# Patient Record
Sex: Male | Born: 1955 | Race: White | Hispanic: No | Marital: Single | Smoking: Current every day smoker
Health system: Southern US, Community
[De-identification: ages and names within clinical notes are randomized; demographics above are authoritative.]

## PROBLEM LIST (undated history)

## (undated) DIAGNOSIS — E785 Hyperlipidemia, unspecified: Secondary | ICD-10-CM

## (undated) DIAGNOSIS — K219 Gastro-esophageal reflux disease without esophagitis: Secondary | ICD-10-CM

## (undated) DIAGNOSIS — I10 Essential (primary) hypertension: Secondary | ICD-10-CM

## (undated) HISTORY — DX: Hyperlipidemia, unspecified: E78.5

## (undated) HISTORY — PX: ANKLE SURGERY: SHX546

## (undated) HISTORY — PX: FRACTURE SURGERY: SHX138

## (undated) HISTORY — DX: Essential (primary) hypertension: I10

---

## 1999-04-15 ENCOUNTER — Encounter: Payer: Self-pay | Admitting: Emergency Medicine

## 1999-04-15 ENCOUNTER — Emergency Department (HOSPITAL_COMMUNITY): Admission: EM | Admit: 1999-04-15 | Discharge: 1999-04-15 | Payer: Self-pay | Admitting: Emergency Medicine

## 2002-09-03 ENCOUNTER — Emergency Department (HOSPITAL_COMMUNITY): Admission: EM | Admit: 2002-09-03 | Discharge: 2002-09-03 | Payer: Self-pay | Admitting: Emergency Medicine

## 2002-09-03 ENCOUNTER — Encounter: Payer: Self-pay | Admitting: Emergency Medicine

## 2003-11-06 ENCOUNTER — Emergency Department (HOSPITAL_COMMUNITY): Admission: EM | Admit: 2003-11-06 | Discharge: 2003-11-07 | Payer: Self-pay

## 2004-06-03 ENCOUNTER — Encounter: Admission: RE | Admit: 2004-06-03 | Discharge: 2004-06-03 | Payer: Self-pay | Admitting: Orthopedic Surgery

## 2004-07-01 ENCOUNTER — Ambulatory Visit (HOSPITAL_BASED_OUTPATIENT_CLINIC_OR_DEPARTMENT_OTHER): Admission: RE | Admit: 2004-07-01 | Discharge: 2004-07-01 | Payer: Self-pay | Admitting: Orthopedic Surgery

## 2004-07-01 ENCOUNTER — Ambulatory Visit (HOSPITAL_COMMUNITY): Admission: RE | Admit: 2004-07-01 | Discharge: 2004-07-01 | Payer: Self-pay | Admitting: Orthopedic Surgery

## 2005-07-07 ENCOUNTER — Ambulatory Visit (HOSPITAL_BASED_OUTPATIENT_CLINIC_OR_DEPARTMENT_OTHER): Admission: RE | Admit: 2005-07-07 | Discharge: 2005-07-07 | Payer: Self-pay | Admitting: Orthopedic Surgery

## 2006-03-09 ENCOUNTER — Ambulatory Visit (HOSPITAL_BASED_OUTPATIENT_CLINIC_OR_DEPARTMENT_OTHER): Admission: RE | Admit: 2006-03-09 | Discharge: 2006-03-10 | Payer: Self-pay | Admitting: Orthopedic Surgery

## 2007-03-03 ENCOUNTER — Ambulatory Visit: Payer: Self-pay | Admitting: Internal Medicine

## 2007-03-03 ENCOUNTER — Ambulatory Visit: Payer: Self-pay | Admitting: Endocrinology

## 2007-03-03 DIAGNOSIS — E785 Hyperlipidemia, unspecified: Secondary | ICD-10-CM | POA: Insufficient documentation

## 2007-03-03 DIAGNOSIS — E559 Vitamin D deficiency, unspecified: Secondary | ICD-10-CM | POA: Insufficient documentation

## 2007-03-03 DIAGNOSIS — M818 Other osteoporosis without current pathological fracture: Secondary | ICD-10-CM | POA: Insufficient documentation

## 2007-03-03 DIAGNOSIS — F17200 Nicotine dependence, unspecified, uncomplicated: Secondary | ICD-10-CM | POA: Insufficient documentation

## 2007-03-04 LAB — CONVERTED CEMR LAB: TSH: 0.91 microintl units/mL (ref 0.35–5.50)

## 2007-03-07 LAB — CONVERTED CEMR LAB
Calcium, Total (PTH): 9.6 mg/dL (ref 8.4–10.5)
PTH: 45.4 pg/mL (ref 14.0–72.0)

## 2007-07-06 ENCOUNTER — Ambulatory Visit (HOSPITAL_BASED_OUTPATIENT_CLINIC_OR_DEPARTMENT_OTHER): Admission: RE | Admit: 2007-07-06 | Discharge: 2007-07-07 | Payer: Self-pay | Admitting: Orthopedic Surgery
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
## 2010-02-18 NOTE — Miscellaneous (Signed)
Summary: BONE DENSITY  Clinical Lists Changes  Orders: Added new Test order of T-Lumbar Vertebral Assessment (77082) - Signed 

## 2010-06-03 NOTE — Op Note (Signed)
NAMEKAORU, REZENDES                ACCOUNT NO.:  [id number]   MEDICAL RECORD NO.:  [id number]          PATIENT TYPE:  AMB   LOCATION:  DSC                          FACILITY:  [facility]   PHYSICIAN:  Leonides Grills, M.D.     DATE OF BIRTH:  1956-01-06   DATE OF PROCEDURE:  07/06/2007  DATE OF DISCHARGE:                               OPERATIVE REPORT   PREOPERATIVE DIAGNOSES:  1. Left subtalar nonunion.  2. Complication of hardware, left foot.   POSTOPERATIVE DIAGNOSES:  1. Left subtalar nonunion.  2. Complication of hardware, left foot.   OPERATION:  1. Revision left subtalar fusion.  2. Hardware removal deep, left foot.  3. Right iliac crest bone graft.  4. Stress x-rays foot.   ANESTHESIA:  General.   SURGEON:  Leonides Grills, MD   ASSISTANT:  Richardean Canal, PA-C.   ESTIMATED BLOOD LOSS:  Minimal.   TOURNIQUET TIME:  Approximately an hour and half.   COMPLICATIONS:  None.   DISPOSITION:  Stable to PR.   INDICATIONS:  This is a 55 year old male who is a smoker.  He has had  two previous attempted subtalar fusions despite telling him to abstain  from smoking.  Even today despite him emphatically denying smoking, you  could smell it on his clothing.  He was consented for the above  procedure.  All risks which included infection, nerve vessel injury,  nonunion, malunion, hardware irritation, hardware failure, persistent  pain, worsening pain, prolonged recovery, stiffness, arthritis in  neighboring joints, hardware irritation, and hardware failure were all  explained.  Questions were encouraged and answered.   OPERATION:  The patient was brought to the operating room and placed in  supine position initially after adequate general endotracheal anesthesia  was administered as well as Ancef 1 g IV piggyback.  We started the  procedure with him in the supine position and we prepped and draped in  sterile manner the right iliac crest graft site as well as the left  lower  extremity over proximally placed thigh tourniquet.  We started the  procedure by making a longitudinal incision over the right iliac crest  graft site.  Dissection was carried down through skin.  Hemostasis was  obtained.  Dissection was carried down to the crest of the iliac crest  and soft tissues elevated off the superior aspect of the iliac crest  over the iliac tubercle.  We then made a hinge using a sagittal saw,  elevated this up, and then obtained a copious amount of cancellus graft.  This was then placed on the back table.  We then sutured down the trap  door through drill holes.  We then  copiously irrigated the area with  normal saline.  Deep fascia and periosteum were closed with 0 Vicryl.  Subcu was closed with 2-0 Vicryl.  Skin was closed with 4-0 Monocryl  subcuticular stitch.  Steri-Strips were applied and Marcaine was applied  in the area without epinephrine.  We then placed underneath the sheaths  via an assistant a bump under the left hip internally rotating the left  lower  extremity and then the left lower extremity was gravity  exsanguinated and the tourniquet was elevated to 290 mmHg.  A  curvilinear incision through the old ollier incision was then made.  Dissection was carried down carefully through skin.  Hemostasis was  obtained.  The peroneal tendons were first identified in the tips of the  inferior aspects of the wound.  This was protected throughout the case.  Soft tissue namely the extensor digitorum brevis was elevated off the  calcaneal neck area anterior aspect of the lateral process of talus into  the sinus tarsi area.  We then entered the subtalar joint and there was  gross fibrous material in the anterior aspect of the subtalar joint.  This was then removed with a synovectomy rongeur, curette, and curved  corners osteotome.  Once this was meticulously done and either side of  the joint was prepared with a curved quarter-inch osteotome scaling the  area,  we then packed in the iliac crest graft using a [place] and a  Engineer, site.  Prior to packing this in place, the 6.5-mm partially-threaded  cancellous screw was removed with a large frag screwdriver under C-arm  guidance.  Once this was removed, we then prepped in the joint space.  The screw was removed through an incision in the heel that was done  previously and this was down to bone.  Once the screw was removed which  was intact and no evidence of hardware failure, we then packed the  subtalar joint with the iliac crest graft and then placed another 6.5-mm  32-mm threaded cancellous screw over a washer, and purposely, we drilled  directly next to a broken screw that was left in from the initial  surgery.  We used this as an interference screw so that when we tighten  the new screw, this would interfere against the broken screw and allow  even greater bite.  This worked up Agricultural consultant.  We were able to close  down the area very well with an excellent bite and using a washer so  that the head of the screw did not subside into the soft bone.  We then  obtained stress x-rays in the AP ankle and lateral views that showed no  gross motion and fixation proposition, excellent alignment as well.  Stress x-rays of foot and ankle in the AP ankle and lateral planes of  the foot showed no gross motion of the subtalar joint fixation and  proposition and excellent alignment as well.  Tourniquet was deflated  and hemostasis was obtained.  The area was copiously irrigated with  normal saline.  We then packed and infused bone graft into a small area  left within the subtalar joint in the anterior aspect of the sinus tarsi  area.  This was small infuse graft.  Once this was packed into place,  hemostasis was obtained.  Subcu was closed with 3-0 Vicryl.  Skin was  closed with 4-0 nylon over all wounds.  Sterile dressing was applied.  Modified Jones dressing was applied with the ankle in neutral  dorsiflexion.   The patient was stable to PR.      Leonides Grills, M.D.  Electronically Signed     PB/MEDQ  D:  07/06/2007  T:  07/07/2007  Job:  [id number]

## 2010-06-06 NOTE — Op Note (Signed)
NAMEDEXTON, [name]                ACCOUNT NO.:  [id number]   MEDICAL RECORD NO.:  [id number]          PATIENT TYPE:  AMB   LOCATION:  DSC                          FACILITY:  [facility]   PHYSICIAN:  Leonides Grills, M.D.     DATE OF BIRTH:  04/19/1955   DATE OF PROCEDURE:  07/07/2005  DATE OF DISCHARGE:                                 OPERATIVE REPORT   PREOPERATIVE DIAGNOSIS:  Complication of hardware, left foot, status post  subtalar distraction, bone block fusion.   POSTOPERATIVE DIAGNOSIS:  Complication of hardware, left foot, status post  subtalar distraction, bone block fusion.   OPERATION:  1.  Hardware removal, deep, left foot.  2.  Stress x-ray, left foot.   ANESTHESIA:  General.   SURGEON:  Leonides Grills, M.D.   ASSISTANT:  Lianne Cure, P.A.   ESTIMATED BLOOD LOSS:  Minimal.   TOURNIQUET:  None.   COMPLICATIONS:  None.   DISPOSITION:  Stable to the PR.   INDICATIONS FOR PROCEDURE:  This is a 55 year old male who is well over 1-  year status post subtalar distraction bone block fusion.  He has done well.  He had pain over his hardware.  He is consented for the above procedure.  All risks which include infection, nerve or vessel injury, persistence of  pain, worsening pain, prolonged recovery, stiffness or arthritis in  neighboring joints were all explained.  Questions were encouraged and  answered.   DESCRIPTION OF PROCEDURE:  The patient was brought to the operating room and  placed in the supine position.  After adequate general endotracheal tube  anesthesia was administered as well as Ancef 1 gram IV piggyback, the left  lower extremity was then prepped and draped in a sterile manner.  No  tourniquet was used.  Longitudinal incision over the previous incision was  made.  Dissection was carried down directly to bone and screw heads.  Screws  were removed.  One screw was broken at the level of the subtalar joint.  The  remaining part of the screw was left in the  bone.  Stress x-rays were  obtained and showed no gross motion across the subtalar joint and  appeared to be a solid union and showed also that this screw was left behind  and was broken at the level of the subtalar joint.  The wound was copiously  irrigated with normal saline.  Skin was closed with 4-0 nylon suture.  Sterile dressing was applied.  Hard shoe was applied.  The patient was  stable to the PR.      Leonides Grills, M.D.  Electronically Signed     PB/MEDQ  D:  07/07/2005  T:  07/08/2005  Job:  [id number]

## 2010-06-06 NOTE — Op Note (Signed)
NAMEALICK, LECOMTE                ACCOUNT NO.:  [id number]   MEDICAL RECORD NO.:  [id number]          PATIENT TYPE:  AMB   LOCATION:  DSC                          FACILITY:  [facility]   PHYSICIAN:  Leonides Grills, M.D.     DATE OF BIRTH:  October 16, 1955   DATE OF PROCEDURE:  03/09/2006  DATE OF DISCHARGE:                               OPERATIVE REPORT   PREOPERATIVE DIAGNOSIS:  Left subtalar nonunion.   POSTOPERATIVE DIAGNOSIS:  Left subtalar nonunion.   OPERATION:  1. Left subtalar fusion.  2. Left proximal tibial local bone graft.  3. Stress x-rays left foot.   ANESTHESIA:  General.   SURGEON:  Leonides Grills, M.D.   ASSISTANT:  Evlyn Kanner, P.A.-C.   ESTIMATED BLOOD LOSS:  Minimal   TOURNIQUET TIME:  Approximately an hour.   COMPLICATIONS:  None.   DISPOSITION:  Stable to PR.   INDICATIONS:  This is a 55 year old male who underwent a left subtalar  distraction bone block fusion.  He subsequently developed a nonunion  that was painful.  He was consented for the above procedure.  All risks  which include infection, neurovascular injury, nonunion, malunion,  hardware rotation, hardware failure, persistent pain, worsened pain,  prolonged recovery, stiffness, and arthritis were all explained.  Questions were encouraged and answered.   OPERATION:  The patient was brought to the operating and placed in a  supine position initially. After adequate general tube anesthesia was  administered with block as well as Ancef 1 gram IV piggyback, the  patient was then placed in the lateral decubitus position with left  operative side up.  All bony prominences were well padded.  An axillary  roll was placed.  The left lower extremity is then prepped and draped in  a sterile manner over a proximally placed thigh tourniquet.  The limb  was gravity exsanguinated and the tourniquet inflated to 290 mmHg.  A  longitudinal incision over Gerdy's tubercle was then made.  Dissection  was carried  down through the skin.  Hemostasis was obtained.  A small L-  shaped periosteal flap was then elevated and with a curved 1/4 inch  osteotome, a small 1 cm by 1 cm hinge was then developed in the outer  cortex. Cancellus bone was then harvested measuring approximately 5-8  mL.  This was placed on the back table.  This was obtained using a  curet.  The hatch was then placed down and the periosteum and a portion  of IT band tendinous insertion was repaired with 2-0 Vicryl.  This had  an Conservation officer, historic buildings.  The area was copiously irrigated with normal  saline.  The subcu was closed with 2-0 Vicryl.  The skin was closed with  4-0 nylon.   We then made an Ollier incision in the sinus tarsi area.  Dissection was  carried down through the skin.  Hemostasis was obtained.  The transverse  digitorum brevis was then sharply elevated and retracted out of harm's  way.  The sinus tarsi was then entered and under C-arm guidance with a  curved 1/4 inch osteotome, the  subtalar joint was entered. The lateral  portion of the subtalar joint was non-united.  The medial part was  intact. We cleaned out about 3/4 of the subtalar joint with the curet as  well as a rongeur as well as a curved 1/4 inch osteotome. We completed  the osteotomy through the subtalar joint with a curved 1/4 inch  osteotome under C-arm guidance carefully, elevated the joint with the  laminar spreader, and then fish-scaled both sides of the subtalar joint  respectively.  We then placed 1.2 mL of VITOSS synthetic bone graft and  mixed this with the cancellus bone obtained locally, made this into a  slurry as well as bone marrow aspirate that was obtained from the  proximal tibia as well as the subtalar joint respectively, which was  mixed into a slurry. This was then packed into the subtalar joint.  We  then placed this a longitudinal incision where the previous incision was  made, a 6.5 mm 16 mm threaded cancellus lag screw using 4.5 and  3.2 mm  drill holes respectively.  This had excellent purchase and maintenance  of the correction.  This was purposely cheated into the lateral portion  of the talus. X-rays were obtained, lateral and AP views of the ankle  and subtalar joint area and stress x-rays showed no gross motion,  fixation in proper position, and compression excellent, as well.  Visually, this was compressed. Stress strain relieving bone graft was  applied in the anterior aspect of the posterior facet of the subtalar  joint with the extra local bone graft that was obtained.  The area was  copiously irrigated with normal saline prior to this. The capsule and  deep tissues were closed with 2-0 Vicryl.  The subcu was closed with 3-0  Vicryl.  The skin was closed with 4-0 nylon.  A sterile dressing was  applied.  Prior to this, the tourniquet was deflated and hemostasis was  obtained.  This was done prior to impaction of the graft. Once the wound  was closed with 4-0 nylon stitch, a sterile dressing was applied, a  modified Jones dressing was applied.  The patient was stable to PR.      Leonides Grills, M.D.  Electronically Signed     PB/MEDQ  D:  03/09/2006  T:  03/09/2006  Job:  [id number]

## 2010-06-06 NOTE — Op Note (Signed)
[name], [name]                ACCOUNT NO.:  [id number]   MEDICAL RECORD NO.:  [id number]          PATIENT TYPE:  AMB   LOCATION:  DSC                          FACILITY:  [facility]   PHYSICIAN:  Leonides Grills, M.D.     DATE OF BIRTH:  1955/05/27   DATE OF PROCEDURE:  07/01/2004  DATE OF DISCHARGE:                                 OPERATIVE REPORT   PREOPERATIVE DIAGNOSES:  1.  Left post traumatic subtalar joint arthritis.  2.  Left tight Achilles tendon.  3.  Left hindfoot malalignment.   POSTOPERATIVE DIAGNOSES:  1.  Left post traumatic subtalar joint arthritis.  2.  Left tight Achilles tendon.  3.  Left hindfoot malalignment.   OPERATION PERFORMED:  1.  Left subtalar distraction, bone block fusion.  2.  Left iliac crest bone graft.  3.  Stress x-rays of left ankle.  4.  Left percutaneous tendo-Achilles lengthening.   SURGEON:  Leonides Grills, M.D.   ASSISTANT:  Lianne Cure, P.A.   ANESTHESIA:  General with popliteal block.   ESTIMATED BLOOD LOSS:  Minimal.   TOURNIQUET TIME:  Approximately an hour and a half.   COMPLICATIONS:  None.   DISPOSITION:  Stable to PR.   INDICATIONS FOR PROCEDURE:  The patient is a 55 year old male who sustained  a calcaneus fracture and developed subtalar joint arthritis with collapse  and horizontal talus with anterior ankle impingement.  He was consented for  the above procedure.  All risks which include infection, neurovascular  injury, nonunion, malunion, hardware irritation, hardware failure,  persistent pain, worsening pain, arthritis in neighboring joints, stiffness,  prolonged recovery were all explained, questions were encouraged and  answered.   DESCRIPTION OF PROCEDURE:  The patient was brought to the operating room and  placed initially in supine position after adequate general endotracheal tube  anesthesia was administered.  Popliteal block as well as Ancef 1 g IV  piggyback.  The patient was then placed in a lateral  decubitus position with  the left operative side up and the left iliac crest bone graft site as well  as left lower extremity were prepped and draped in sterile manner over a  proximally placed thigh tourniquet.  We started the procedure with a  longitudinal incision over the left iliac crest.  Dissection was carried  down through skin.  Hemostasis was obtained.  Dissection was carried down  directly to the crest which was palpable throughout the dissection.  Soft  tissue was elevated off the inner and outer table and was meticulously  stayed onto bone.  Soft tissues were protected along the medial and lateral  columns of the iliac crest.  With a 1 cm sagittal saw, a trapezoidal shaped  tricortical bone graft was harvested using a curved   quarter inch osteotome  as well.  This was placed on a back table for later placement.  Cancellous  bone was then scooped out from either side of the crest respectively and  again this was placed on the back table as well for later graft.  We then  copiously irrigated the area  with normal saline.  Bone wax was applied to  exposed bone surfaces.  Again it was copiously irrigated with normal saline.  There was no pulsatile bleeding or bleeding within the wound.  Deep tissue  and fascia were closed with 0 Vicryl suture.  Subcu was closed with 2-0  Vicryl suture, skin was closed with 4-0 Monocryl  subcuticular, Steri-Strips  were applied.  We then gravity exsanguinated the left lower extremity and  the tourniquet was elevated 290 mmHg.  A longitudinal incision over the  posterolateral aspect of the left ankle was then made.  Dissection was  carried down through skin and hemostasis was obtained.  Sural nerve was  identified and sural nerve neurolysis within the wound was then performed  and the nerve was retracted anteriorly and protected out of harm's way  throughout the entire procedure.  Dissection was carried down directly to  the subtalar joint.  There  was scar tissue in this area and this was  rongeured out under C-arm guidance.  The subtalar joint was verified up with  the Glorious Peach to be in the proper position.  Once the posterior capsule was  removed from the talocalcaneal area as well as soft tissue, we then placed a  laminar spreader within the joint to jack out the joint.  The remaining  arthritic cartilage was then removed with a curved quarter inch osteotome as  well as a curette.  Once this was completely denuded of cartilage, multiple  2 mm drill holes were placed on either side of the joint after the area was  copiously irrigated with normal saline and at this point we verified with  the lamina spreader in place and the size of the bone blocks that we  harvested that the hindfoot was in proper position with 5 to 7 degrees  hindfoot valgus.  We then placed the cancellous graft in first as a bed  within the subtalar joint as well as anteriorly.  We then tamped in the  tricortical bone block within the subtalar joint.  This fit beautifully and  was tamped into place with a bone tamp.  Once this was in a proper position  and verified under C-arm guidance to be in adequate position and palpated,  with stress x-rays we were able to range the ankle in dorsiflexion and  plantar flexion and found that there were no impinging areas whatsoever.  This was very stable innately.  Once this was tamped into place, we then  with a 3.5 mm drill through a stab wound that was longitudinally midline in  the glabral skin area of the heel, drilled a drill hole across the subtalar  joint.  The first screw which was in the lateral body with a 6.5 mm fully  threaded cancellous screw measuring about 70 mm.  The second screw which was  started just medial to this was aimed towards the main body base of the  talar neck area.  This was a 75 mm long 6.5 mm fully threaded cancellous  screw.  Both screws had excellent purchase and maintenance of the correction.   Final stress x-rays were obtained in the AP ankle mortise view  as well as lateral and showed that the fixation was in a proper position as  well as correction with maximum dorsiflexion.  There was adequate clearance  anteriorly, anterior part of the talar neck and anterior distal tibia and  then plantar flexion had adequate clearance posteriorly as well.  The  remaining bone  graft was obtained and drills were placed in the subtalar  joint and bur was used to perform stress-strain relieving bone graft.  After  the area was copiously irrigated with normal saline, tourniquet was  deflated, hemostasis was obtained and there was no pulsatile bleeding.  Subcu was closed with 3-0 Vicryl.  Skin was closed with 4-0 nylon.  We then  performed a percutaneous tendo-Achilles lengthening with two medial and one  lateral hemisections in the Achilles tendon.  This had an excellent release  of the tight Achilles tendon and this was performed as described by Myrene Buddy.  At this point once the wounds were all closed with 4-0 nylon suture, sterile  dressing was applied.  Modified Jones dressing was applied with the ankle in  neutral dorsiflexion.  The patient was stable to the PR.     PB/MEDQ  D:  07/01/2004  T:  07/01/2004  Job:  [id number]

## 2010-10-16 LAB — POCT HEMOGLOBIN-HEMACUE: Hemoglobin: 14.7

## 2015-04-05 DIAGNOSIS — Z0000 Encounter for general adult medical examination without abnormal findings: Secondary | ICD-10-CM | POA: Diagnosis not present

## 2015-04-05 DIAGNOSIS — I10 Essential (primary) hypertension: Secondary | ICD-10-CM | POA: Diagnosis not present

## 2015-04-05 DIAGNOSIS — E782 Mixed hyperlipidemia: Secondary | ICD-10-CM | POA: Diagnosis not present

## 2015-06-24 DIAGNOSIS — M79671 Pain in right foot: Secondary | ICD-10-CM | POA: Diagnosis not present

## 2015-06-24 DIAGNOSIS — G8929 Other chronic pain: Secondary | ICD-10-CM | POA: Diagnosis not present

## 2015-06-24 DIAGNOSIS — M79672 Pain in left foot: Secondary | ICD-10-CM | POA: Diagnosis not present

## 2015-07-08 DIAGNOSIS — E782 Mixed hyperlipidemia: Secondary | ICD-10-CM | POA: Diagnosis not present

## 2015-07-08 DIAGNOSIS — Z7689 Persons encountering health services in other specified circumstances: Secondary | ICD-10-CM | POA: Diagnosis not present

## 2015-07-08 DIAGNOSIS — Z125 Encounter for screening for malignant neoplasm of prostate: Secondary | ICD-10-CM | POA: Diagnosis not present

## 2015-07-08 DIAGNOSIS — Z131 Encounter for screening for diabetes mellitus: Secondary | ICD-10-CM | POA: Diagnosis not present

## 2015-07-08 DIAGNOSIS — I10 Essential (primary) hypertension: Secondary | ICD-10-CM | POA: Diagnosis not present

## 2015-10-08 DIAGNOSIS — Z0000 Encounter for general adult medical examination without abnormal findings: Secondary | ICD-10-CM | POA: Diagnosis not present

## 2015-10-08 DIAGNOSIS — I10 Essential (primary) hypertension: Secondary | ICD-10-CM | POA: Diagnosis not present

## 2015-10-08 DIAGNOSIS — Z1389 Encounter for screening for other disorder: Secondary | ICD-10-CM | POA: Diagnosis not present

## 2015-10-08 DIAGNOSIS — E782 Mixed hyperlipidemia: Secondary | ICD-10-CM | POA: Diagnosis not present

## 2015-11-20 DIAGNOSIS — I10 Essential (primary) hypertension: Secondary | ICD-10-CM | POA: Diagnosis not present

## 2015-11-20 DIAGNOSIS — E784 Other hyperlipidemia: Secondary | ICD-10-CM | POA: Diagnosis not present

## 2016-01-02 DIAGNOSIS — I10 Essential (primary) hypertension: Secondary | ICD-10-CM | POA: Diagnosis not present

## 2016-01-02 DIAGNOSIS — E784 Other hyperlipidemia: Secondary | ICD-10-CM | POA: Diagnosis not present

## 2016-01-07 DIAGNOSIS — M545 Low back pain: Secondary | ICD-10-CM | POA: Diagnosis not present

## 2016-01-07 DIAGNOSIS — I10 Essential (primary) hypertension: Secondary | ICD-10-CM | POA: Diagnosis not present

## 2016-01-07 DIAGNOSIS — E782 Mixed hyperlipidemia: Secondary | ICD-10-CM | POA: Diagnosis not present

## 2016-02-04 DIAGNOSIS — E782 Mixed hyperlipidemia: Secondary | ICD-10-CM | POA: Diagnosis not present

## 2016-02-04 DIAGNOSIS — I10 Essential (primary) hypertension: Secondary | ICD-10-CM | POA: Diagnosis not present

## 2016-02-04 DIAGNOSIS — M545 Low back pain: Secondary | ICD-10-CM | POA: Diagnosis not present

## 2016-02-27 DIAGNOSIS — E782 Mixed hyperlipidemia: Secondary | ICD-10-CM | POA: Diagnosis not present

## 2016-02-27 DIAGNOSIS — M545 Low back pain: Secondary | ICD-10-CM | POA: Diagnosis not present

## 2016-02-27 DIAGNOSIS — I10 Essential (primary) hypertension: Secondary | ICD-10-CM | POA: Diagnosis not present

## 2016-03-24 DIAGNOSIS — E782 Mixed hyperlipidemia: Secondary | ICD-10-CM | POA: Diagnosis not present

## 2016-03-24 DIAGNOSIS — I10 Essential (primary) hypertension: Secondary | ICD-10-CM | POA: Diagnosis not present

## 2016-03-24 DIAGNOSIS — M545 Low back pain: Secondary | ICD-10-CM | POA: Diagnosis not present

## 2016-04-08 DIAGNOSIS — Z1389 Encounter for screening for other disorder: Secondary | ICD-10-CM | POA: Diagnosis not present

## 2016-04-08 DIAGNOSIS — I10 Essential (primary) hypertension: Secondary | ICD-10-CM | POA: Diagnosis not present

## 2016-04-08 DIAGNOSIS — Z0000 Encounter for general adult medical examination without abnormal findings: Secondary | ICD-10-CM | POA: Diagnosis not present

## 2016-04-08 DIAGNOSIS — M545 Low back pain: Secondary | ICD-10-CM | POA: Diagnosis not present

## 2016-04-08 DIAGNOSIS — E782 Mixed hyperlipidemia: Secondary | ICD-10-CM | POA: Diagnosis not present

## 2016-04-27 DIAGNOSIS — I10 Essential (primary) hypertension: Secondary | ICD-10-CM | POA: Diagnosis not present

## 2016-04-27 DIAGNOSIS — E784 Other hyperlipidemia: Secondary | ICD-10-CM | POA: Diagnosis not present

## 2016-05-28 DIAGNOSIS — E784 Other hyperlipidemia: Secondary | ICD-10-CM | POA: Diagnosis not present

## 2016-05-28 DIAGNOSIS — I10 Essential (primary) hypertension: Secondary | ICD-10-CM | POA: Diagnosis not present

## 2016-07-10 DIAGNOSIS — E782 Mixed hyperlipidemia: Secondary | ICD-10-CM | POA: Diagnosis not present

## 2016-07-10 DIAGNOSIS — I10 Essential (primary) hypertension: Secondary | ICD-10-CM | POA: Diagnosis not present

## 2016-07-13 DIAGNOSIS — E782 Mixed hyperlipidemia: Secondary | ICD-10-CM | POA: Diagnosis not present

## 2016-07-13 DIAGNOSIS — I10 Essential (primary) hypertension: Secondary | ICD-10-CM | POA: Diagnosis not present

## 2016-07-13 DIAGNOSIS — Z125 Encounter for screening for malignant neoplasm of prostate: Secondary | ICD-10-CM | POA: Diagnosis not present

## 2016-10-02 DIAGNOSIS — E784 Other hyperlipidemia: Secondary | ICD-10-CM | POA: Diagnosis not present

## 2016-10-02 DIAGNOSIS — I10 Essential (primary) hypertension: Secondary | ICD-10-CM | POA: Diagnosis not present

## 2016-10-15 DIAGNOSIS — M85851 Other specified disorders of bone density and structure, right thigh: Secondary | ICD-10-CM | POA: Diagnosis not present

## 2016-10-15 DIAGNOSIS — E782 Mixed hyperlipidemia: Secondary | ICD-10-CM | POA: Diagnosis not present

## 2016-10-15 DIAGNOSIS — M810 Age-related osteoporosis without current pathological fracture: Secondary | ICD-10-CM | POA: Diagnosis not present

## 2016-10-15 DIAGNOSIS — I10 Essential (primary) hypertension: Secondary | ICD-10-CM | POA: Diagnosis not present

## 2016-11-20 ENCOUNTER — Ambulatory Visit (INDEPENDENT_AMBULATORY_CARE_PROVIDER_SITE_OTHER): Payer: Worker's Compensation

## 2016-11-20 ENCOUNTER — Encounter: Payer: Self-pay | Admitting: Family Medicine

## 2016-11-20 ENCOUNTER — Ambulatory Visit: Payer: Worker's Compensation

## 2016-11-20 ENCOUNTER — Ambulatory Visit (INDEPENDENT_AMBULATORY_CARE_PROVIDER_SITE_OTHER): Payer: Worker's Compensation | Admitting: Family Medicine

## 2016-11-20 VITALS — BP 134/87 | HR 86 | Temp 97.4°F | Ht 66.0 in | Wt 215.0 lb

## 2016-11-20 DIAGNOSIS — M25572 Pain in left ankle and joints of left foot: Secondary | ICD-10-CM | POA: Diagnosis not present

## 2016-11-20 NOTE — Progress Notes (Signed)
BP 134/87   Pulse 86   Temp (!) 97.4 F (36.3 C) (Oral)   Ht 5\' 6"  (1.676 m)   Wt 215 lb (97.5 kg)   BMI 34.70 kg/m    Subjective:    Patient ID: [name], male    DOB: [birth year], 61 y.o.   MRN: [id number]  HPI: [name] is a 61 y.o. 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  It is a workers comp visit.  The injury happened today 11/20/2016.  He was at work and somebody was moving pallets with a forklift and the palate hit the inside of his foot with the corner of the palate.  Since that time he has been having difficulty walking on it he has swelling bruising and discoloration in his toes.  He said he can bear some weight on it but it hurts significantly to do that.  He denies any fevers or chills or redness or warmth.  He does admit that he has a history of previous ankle fracture and trauma that started in 2005 and has had surgeries for that but he was doing well and that is why he was back at work until this happened.  Relevant past medical, surgical, family and social history reviewed and updated as indicated. Interim medical history since our last visit reviewed. Allergies and medications reviewed and updated.  Review of Systems  Constitutional: Negative for chills and fever.  Respiratory: Negative for shortness of breath and wheezing.   Cardiovascular: Negative for chest pain and leg swelling.  Musculoskeletal: Positive for arthralgias and joint swelling. Negative for back pain and gait problem.  Skin: Positive for color change. Negative for rash.  All other systems reviewed and are negative.   Per HPI unless specifically indicated above        Objective:    BP 134/87   Pulse 86   Temp (!) 97.4 F (36.3 C) (Oral)   Ht 5\' 6"  (1.676 m)   Wt 215  lb (97.5 kg)   BMI 34.70 kg/m   Wt Readings from Last 3 Encounters:  11/20/16 215 lb (97.5 kg)    Physical Exam  Constitutional: He is oriented to person, place, and time. He appears well-developed and well-nourished. No distress.  Eyes: Conjunctivae are normal. No scleral icterus.  Cardiovascular: Regular rhythm.   Musculoskeletal: Normal range of motion. He exhibits no edema.       Left ankle: He exhibits swelling and ecchymosis. He exhibits normal range of motion, no deformity, no laceration and normal pulse. Tenderness. Medial malleolus, AITFL and posterior TFL tenderness found. Achilles tendon exhibits no pain.       Feet:  Neurological: He is alert and oriented to person, place, and time. Coordination normal.  Skin: Skin is warm and dry. No rash noted. He is not diaphoretic.  Psychiatric: He has a normal mood and affect. His behavior is normal.  Nursing note and vitals reviewed.   Left ankle x-ray: Difficult to tell if there is any new fractures because of previous calcaneal fractures with screws, await radiologist read    Assessment & Plan:   Problem List Items Addressed This Visit    None    Visit Diagnoses    Acute left ankle pain    -  Primary   Patient has history of multiple surgeries in the ankle,   Relevant Orders   DG Foot Complete Left (Completed)   DG Ankle Complete Left (Completed)   DME Crutches     Patient is recommended to be nonweightbearing until he hears back from [place] about the ankle x-ray, from there he will have to take 30-40 minutes max on feet and then breaks between as tolerated and gradually increase the amount that he can be on his feet over the next 2 weeks.  If fractured we will get him to orthopedic and he will continue to be nonweightbearing  Follow up plan: Return if symptoms worsen or fail to improve.  Counseling provided for all of the vaccine components Orders Placed This Encounter  Procedures  . DG Foot Complete Left  . DG Ankle  Complete Left    Caryl Pina, MD [facility] 11/20/2016, 11:33 AM

## 2016-11-23 ENCOUNTER — Telehealth: Payer: Self-pay | Admitting: Family Medicine

## 2016-11-23 ENCOUNTER — Encounter: Payer: Self-pay | Admitting: *Deleted

## 2017-01-13 DIAGNOSIS — E782 Mixed hyperlipidemia: Secondary | ICD-10-CM | POA: Diagnosis not present

## 2017-01-13 DIAGNOSIS — I10 Essential (primary) hypertension: Secondary | ICD-10-CM | POA: Diagnosis not present

## 2017-01-13 DIAGNOSIS — K219 Gastro-esophageal reflux disease without esophagitis: Secondary | ICD-10-CM | POA: Diagnosis not present

## 2017-01-14 DIAGNOSIS — E782 Mixed hyperlipidemia: Secondary | ICD-10-CM | POA: Diagnosis not present

## 2017-01-14 DIAGNOSIS — I10 Essential (primary) hypertension: Secondary | ICD-10-CM | POA: Diagnosis not present

## 2017-03-10 DIAGNOSIS — H1013 Acute atopic conjunctivitis, bilateral: Secondary | ICD-10-CM | POA: Diagnosis not present

## 2017-03-10 DIAGNOSIS — H5213 Myopia, bilateral: Secondary | ICD-10-CM | POA: Diagnosis not present

## 2017-03-10 DIAGNOSIS — H40033 Anatomical narrow angle, bilateral: Secondary | ICD-10-CM | POA: Diagnosis not present

## 2017-04-15 DIAGNOSIS — L570 Actinic keratosis: Secondary | ICD-10-CM | POA: Diagnosis not present

## 2017-04-15 DIAGNOSIS — E782 Mixed hyperlipidemia: Secondary | ICD-10-CM | POA: Diagnosis not present

## 2017-04-15 DIAGNOSIS — I10 Essential (primary) hypertension: Secondary | ICD-10-CM | POA: Diagnosis not present

## 2017-07-15 DIAGNOSIS — I10 Essential (primary) hypertension: Secondary | ICD-10-CM | POA: Diagnosis not present

## 2017-07-15 DIAGNOSIS — K219 Gastro-esophageal reflux disease without esophagitis: Secondary | ICD-10-CM | POA: Diagnosis not present

## 2017-07-15 DIAGNOSIS — M545 Low back pain: Secondary | ICD-10-CM | POA: Diagnosis not present

## 2017-07-15 DIAGNOSIS — E782 Mixed hyperlipidemia: Secondary | ICD-10-CM | POA: Diagnosis not present

## 2017-07-15 DIAGNOSIS — Z0000 Encounter for general adult medical examination without abnormal findings: Secondary | ICD-10-CM | POA: Diagnosis not present

## 2017-07-15 DIAGNOSIS — Z1389 Encounter for screening for other disorder: Secondary | ICD-10-CM | POA: Diagnosis not present

## 2017-07-19 DIAGNOSIS — I10 Essential (primary) hypertension: Secondary | ICD-10-CM | POA: Diagnosis not present

## 2017-07-19 DIAGNOSIS — Z125 Encounter for screening for malignant neoplasm of prostate: Secondary | ICD-10-CM | POA: Diagnosis not present

## 2017-07-19 DIAGNOSIS — M545 Low back pain: Secondary | ICD-10-CM | POA: Diagnosis not present

## 2017-07-19 DIAGNOSIS — Z1389 Encounter for screening for other disorder: Secondary | ICD-10-CM | POA: Diagnosis not present

## 2017-07-19 DIAGNOSIS — Z0000 Encounter for general adult medical examination without abnormal findings: Secondary | ICD-10-CM | POA: Diagnosis not present

## 2017-08-19 DIAGNOSIS — G4730 Sleep apnea, unspecified: Secondary | ICD-10-CM | POA: Diagnosis not present

## 2017-08-20 DIAGNOSIS — G4730 Sleep apnea, unspecified: Secondary | ICD-10-CM | POA: Diagnosis not present

## 2017-09-03 DIAGNOSIS — G4733 Obstructive sleep apnea (adult) (pediatric): Secondary | ICD-10-CM | POA: Diagnosis not present

## 2017-09-08 DIAGNOSIS — K219 Gastro-esophageal reflux disease without esophagitis: Secondary | ICD-10-CM | POA: Diagnosis not present

## 2017-09-08 DIAGNOSIS — I10 Essential (primary) hypertension: Secondary | ICD-10-CM | POA: Diagnosis not present

## 2017-09-08 DIAGNOSIS — E782 Mixed hyperlipidemia: Secondary | ICD-10-CM | POA: Diagnosis not present

## 2017-09-21 DIAGNOSIS — G4733 Obstructive sleep apnea (adult) (pediatric): Secondary | ICD-10-CM | POA: Diagnosis not present

## 2017-10-05 DIAGNOSIS — E782 Mixed hyperlipidemia: Secondary | ICD-10-CM | POA: Diagnosis not present

## 2017-10-05 DIAGNOSIS — K219 Gastro-esophageal reflux disease without esophagitis: Secondary | ICD-10-CM | POA: Diagnosis not present

## 2017-10-05 DIAGNOSIS — I10 Essential (primary) hypertension: Secondary | ICD-10-CM | POA: Diagnosis not present

## 2017-10-14 DIAGNOSIS — M545 Low back pain: Secondary | ICD-10-CM | POA: Diagnosis not present

## 2017-10-14 DIAGNOSIS — I10 Essential (primary) hypertension: Secondary | ICD-10-CM | POA: Diagnosis not present

## 2017-10-14 DIAGNOSIS — Z0000 Encounter for general adult medical examination without abnormal findings: Secondary | ICD-10-CM | POA: Diagnosis not present

## 2017-10-14 DIAGNOSIS — E7849 Other hyperlipidemia: Secondary | ICD-10-CM | POA: Diagnosis not present

## 2017-10-21 DIAGNOSIS — G4733 Obstructive sleep apnea (adult) (pediatric): Secondary | ICD-10-CM | POA: Diagnosis not present

## 2017-11-08 DIAGNOSIS — I10 Essential (primary) hypertension: Secondary | ICD-10-CM | POA: Diagnosis not present

## 2017-11-21 DIAGNOSIS — G4733 Obstructive sleep apnea (adult) (pediatric): Secondary | ICD-10-CM | POA: Diagnosis not present

## 2017-11-24 DIAGNOSIS — I10 Essential (primary) hypertension: Secondary | ICD-10-CM | POA: Diagnosis not present

## 2017-12-21 DIAGNOSIS — G4733 Obstructive sleep apnea (adult) (pediatric): Secondary | ICD-10-CM | POA: Diagnosis not present

## 2018-01-05 DIAGNOSIS — G4733 Obstructive sleep apnea (adult) (pediatric): Secondary | ICD-10-CM | POA: Diagnosis not present

## 2018-01-14 DIAGNOSIS — M545 Low back pain: Secondary | ICD-10-CM | POA: Diagnosis not present

## 2018-01-14 DIAGNOSIS — L575 Actinic granuloma: Secondary | ICD-10-CM | POA: Diagnosis not present

## 2018-01-14 DIAGNOSIS — L0292 Furuncle, unspecified: Secondary | ICD-10-CM | POA: Diagnosis not present

## 2018-01-14 DIAGNOSIS — E7849 Other hyperlipidemia: Secondary | ICD-10-CM | POA: Diagnosis not present

## 2018-01-14 DIAGNOSIS — I10 Essential (primary) hypertension: Secondary | ICD-10-CM | POA: Diagnosis not present

## 2018-01-18 DIAGNOSIS — I10 Essential (primary) hypertension: Secondary | ICD-10-CM | POA: Diagnosis not present

## 2018-01-21 DIAGNOSIS — G4733 Obstructive sleep apnea (adult) (pediatric): Secondary | ICD-10-CM | POA: Diagnosis not present

## 2018-02-14 DIAGNOSIS — E7849 Other hyperlipidemia: Secondary | ICD-10-CM | POA: Diagnosis not present

## 2018-02-14 DIAGNOSIS — M545 Low back pain: Secondary | ICD-10-CM | POA: Diagnosis not present

## 2018-02-14 DIAGNOSIS — I10 Essential (primary) hypertension: Secondary | ICD-10-CM | POA: Diagnosis not present

## 2018-02-21 DIAGNOSIS — G4733 Obstructive sleep apnea (adult) (pediatric): Secondary | ICD-10-CM | POA: Diagnosis not present

## 2018-03-09 DIAGNOSIS — G44209 Tension-type headache, unspecified, not intractable: Secondary | ICD-10-CM | POA: Diagnosis not present

## 2018-03-09 DIAGNOSIS — H40033 Anatomical narrow angle, bilateral: Secondary | ICD-10-CM | POA: Diagnosis not present

## 2018-03-14 DIAGNOSIS — I10 Essential (primary) hypertension: Secondary | ICD-10-CM | POA: Diagnosis not present

## 2018-03-14 DIAGNOSIS — M545 Low back pain: Secondary | ICD-10-CM | POA: Diagnosis not present

## 2018-03-14 DIAGNOSIS — E7849 Other hyperlipidemia: Secondary | ICD-10-CM | POA: Diagnosis not present

## 2018-03-22 DIAGNOSIS — G4733 Obstructive sleep apnea (adult) (pediatric): Secondary | ICD-10-CM | POA: Diagnosis not present

## 2018-04-03 IMAGING — DX DG ANKLE COMPLETE 3+V*L*
3 series · 3 of 3 positions shown · non-contrast
Comparison: Foot series performed today.

CLINICAL DATA: Hit medial foot, pain. Old calcaneal fracture and
surgery.

EXAM:
LEFT ANKLE COMPLETE - 3+ VIEW

[ankle ap]
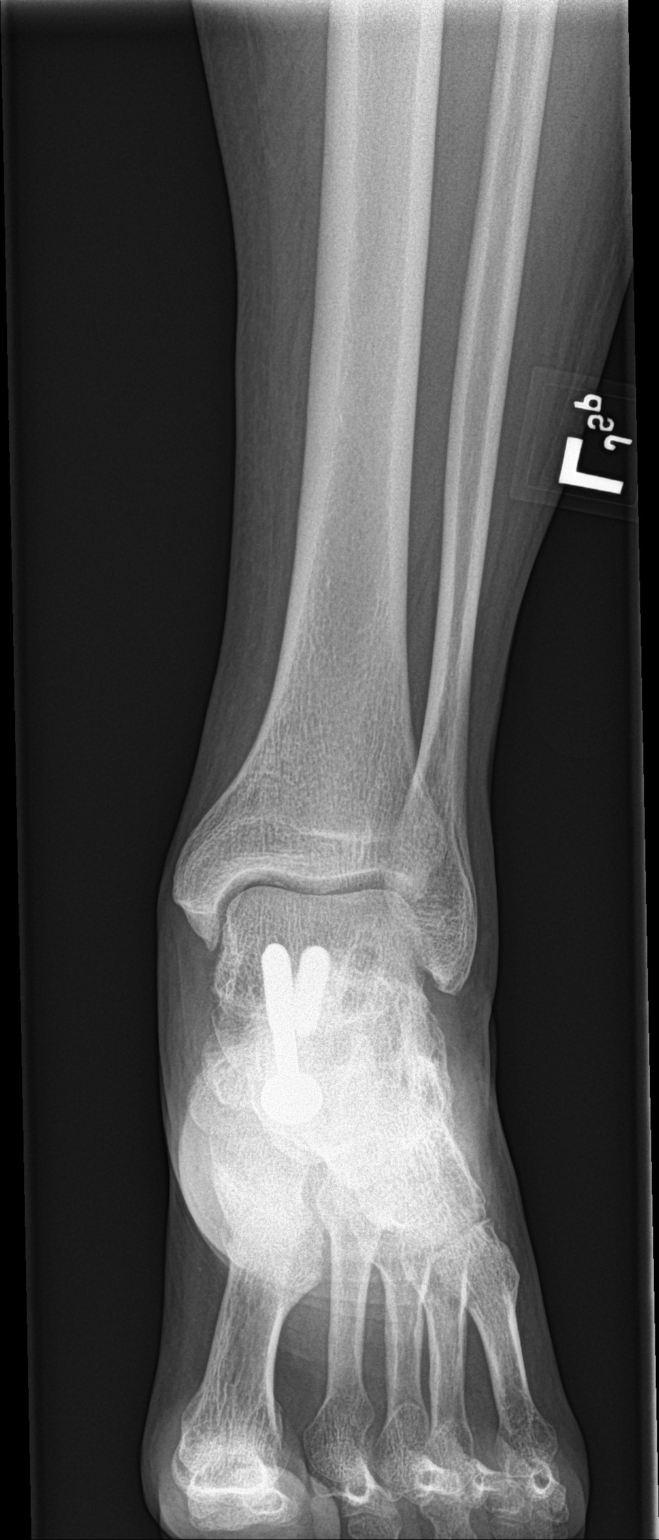

[ankle obl]
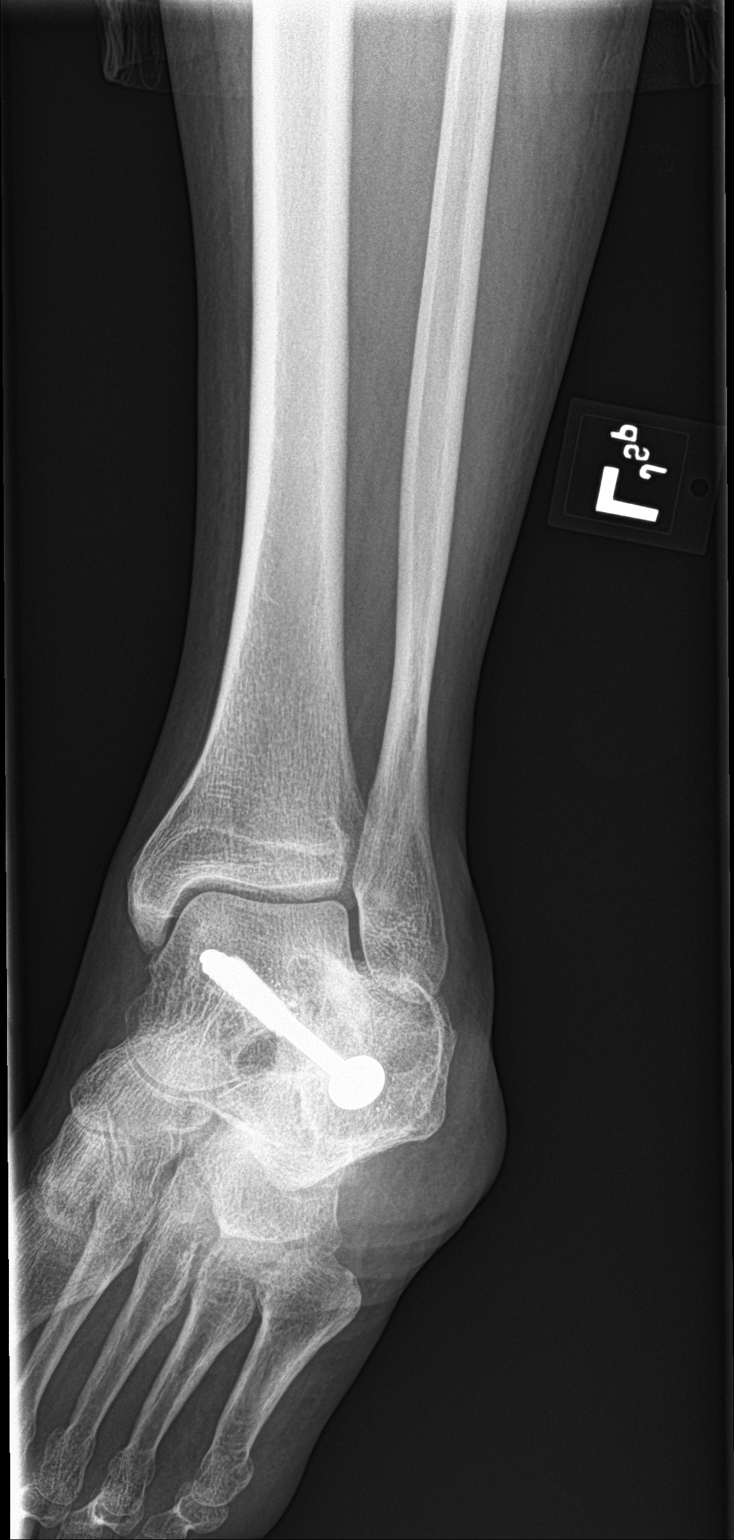

[ankle lat]
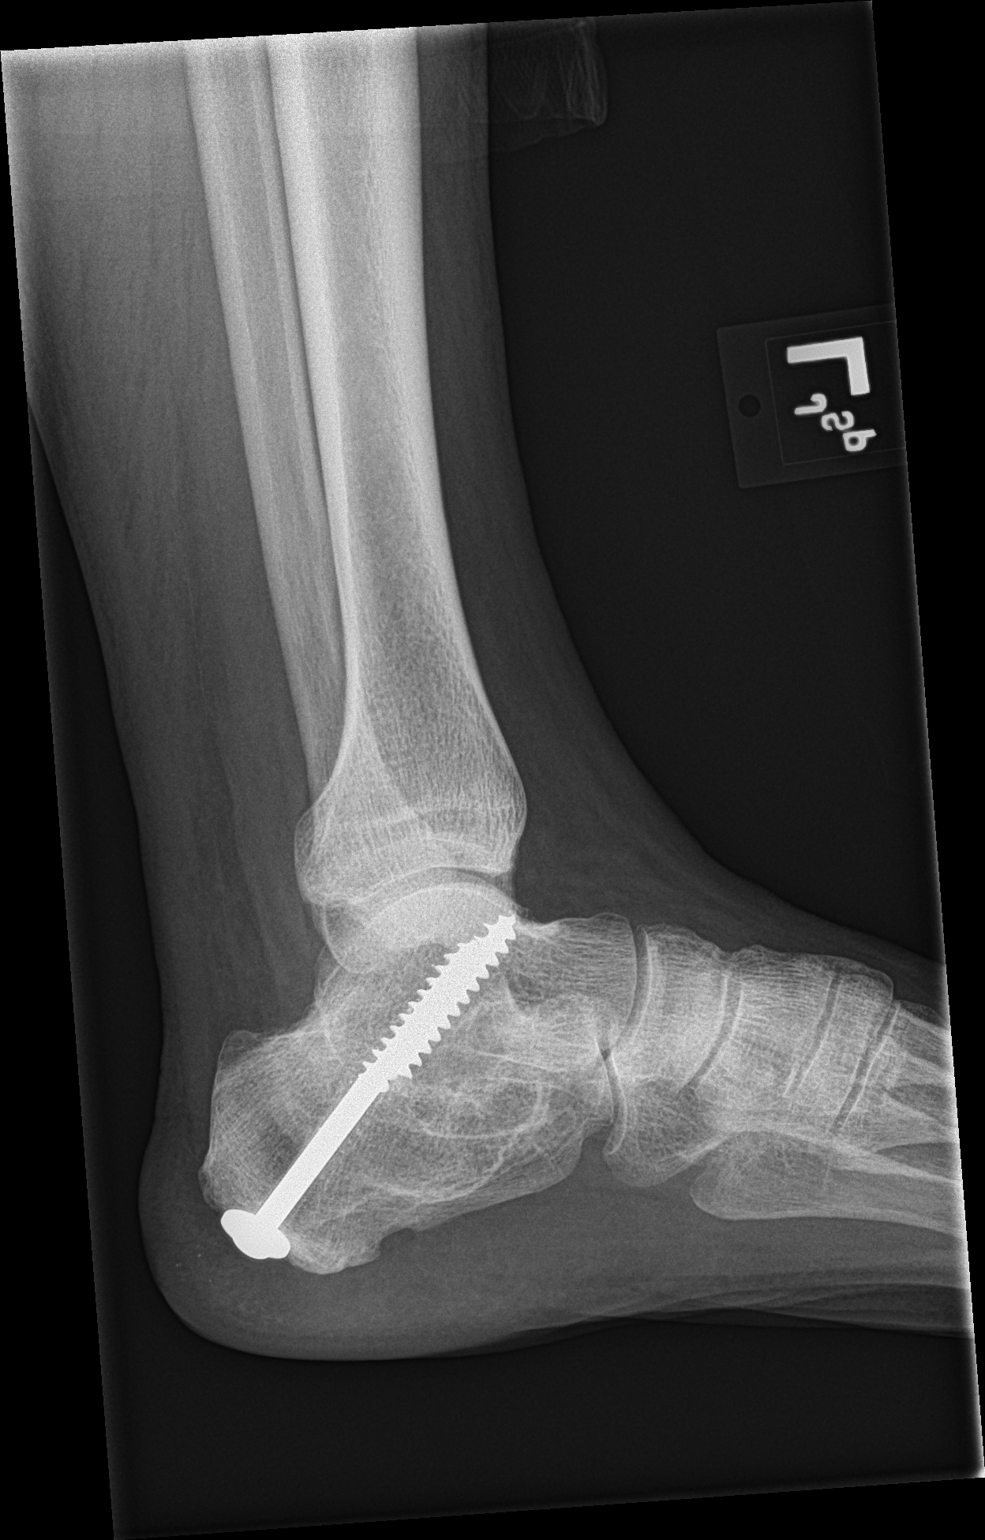

[3 of 3 positions shown; findings below may reference images not displayed]

FINDINGS: Screws noted within the left calcaneus crossing into the talus with
subtalar fusion. Ankle joint is maintained. No acute bony
abnormality. Specifically, no acute fracture, subluxation, or
dislocation. Soft tissues are intact.
IMPRESSION: No acute bony abnormality.

## 2018-04-14 DIAGNOSIS — E7849 Other hyperlipidemia: Secondary | ICD-10-CM | POA: Diagnosis not present

## 2018-04-14 DIAGNOSIS — I10 Essential (primary) hypertension: Secondary | ICD-10-CM | POA: Diagnosis not present

## 2018-04-14 DIAGNOSIS — M545 Low back pain: Secondary | ICD-10-CM | POA: Diagnosis not present

## 2018-04-15 DIAGNOSIS — G4733 Obstructive sleep apnea (adult) (pediatric): Secondary | ICD-10-CM | POA: Diagnosis not present

## 2018-04-20 DIAGNOSIS — Z1389 Encounter for screening for other disorder: Secondary | ICD-10-CM | POA: Diagnosis not present

## 2018-04-20 DIAGNOSIS — N182 Chronic kidney disease, stage 2 (mild): Secondary | ICD-10-CM | POA: Diagnosis not present

## 2018-04-20 DIAGNOSIS — Z0000 Encounter for general adult medical examination without abnormal findings: Secondary | ICD-10-CM | POA: Diagnosis not present

## 2018-04-20 DIAGNOSIS — I10 Essential (primary) hypertension: Secondary | ICD-10-CM | POA: Diagnosis not present

## 2018-04-20 DIAGNOSIS — M545 Low back pain: Secondary | ICD-10-CM | POA: Diagnosis not present

## 2018-04-20 DIAGNOSIS — E7849 Other hyperlipidemia: Secondary | ICD-10-CM | POA: Diagnosis not present

## 2018-04-22 DIAGNOSIS — G4733 Obstructive sleep apnea (adult) (pediatric): Secondary | ICD-10-CM | POA: Diagnosis not present

## 2018-05-10 DIAGNOSIS — M545 Low back pain: Secondary | ICD-10-CM | POA: Diagnosis not present

## 2018-05-10 DIAGNOSIS — I10 Essential (primary) hypertension: Secondary | ICD-10-CM | POA: Diagnosis not present

## 2018-05-10 DIAGNOSIS — N182 Chronic kidney disease, stage 2 (mild): Secondary | ICD-10-CM | POA: Diagnosis not present

## 2018-05-10 DIAGNOSIS — E7849 Other hyperlipidemia: Secondary | ICD-10-CM | POA: Diagnosis not present

## 2018-05-22 DIAGNOSIS — G4733 Obstructive sleep apnea (adult) (pediatric): Secondary | ICD-10-CM | POA: Diagnosis not present

## 2018-06-22 DIAGNOSIS — G4733 Obstructive sleep apnea (adult) (pediatric): Secondary | ICD-10-CM | POA: Diagnosis not present

## 2018-06-23 DIAGNOSIS — N182 Chronic kidney disease, stage 2 (mild): Secondary | ICD-10-CM | POA: Diagnosis not present

## 2018-06-23 DIAGNOSIS — M545 Low back pain: Secondary | ICD-10-CM | POA: Diagnosis not present

## 2018-06-23 DIAGNOSIS — E7849 Other hyperlipidemia: Secondary | ICD-10-CM | POA: Diagnosis not present

## 2018-06-23 DIAGNOSIS — I10 Essential (primary) hypertension: Secondary | ICD-10-CM | POA: Diagnosis not present

## 2018-06-30 DIAGNOSIS — M19072 Primary osteoarthritis, left ankle and foot: Secondary | ICD-10-CM | POA: Diagnosis not present

## 2018-06-30 DIAGNOSIS — M79672 Pain in left foot: Secondary | ICD-10-CM | POA: Diagnosis not present

## 2018-07-20 DIAGNOSIS — I10 Essential (primary) hypertension: Secondary | ICD-10-CM | POA: Diagnosis not present

## 2018-07-20 DIAGNOSIS — Z0000 Encounter for general adult medical examination without abnormal findings: Secondary | ICD-10-CM | POA: Diagnosis not present

## 2018-07-20 DIAGNOSIS — M545 Low back pain: Secondary | ICD-10-CM | POA: Diagnosis not present

## 2018-07-20 DIAGNOSIS — N182 Chronic kidney disease, stage 2 (mild): Secondary | ICD-10-CM | POA: Diagnosis not present

## 2018-07-20 DIAGNOSIS — E7849 Other hyperlipidemia: Secondary | ICD-10-CM | POA: Diagnosis not present

## 2018-07-21 DIAGNOSIS — M545 Low back pain: Secondary | ICD-10-CM | POA: Diagnosis not present

## 2018-07-21 DIAGNOSIS — N182 Chronic kidney disease, stage 2 (mild): Secondary | ICD-10-CM | POA: Diagnosis not present

## 2018-07-21 DIAGNOSIS — E7849 Other hyperlipidemia: Secondary | ICD-10-CM | POA: Diagnosis not present

## 2018-07-21 DIAGNOSIS — I10 Essential (primary) hypertension: Secondary | ICD-10-CM | POA: Diagnosis not present

## 2018-08-22 DIAGNOSIS — N182 Chronic kidney disease, stage 2 (mild): Secondary | ICD-10-CM | POA: Diagnosis not present

## 2018-08-22 DIAGNOSIS — I10 Essential (primary) hypertension: Secondary | ICD-10-CM | POA: Diagnosis not present

## 2018-08-22 DIAGNOSIS — M545 Low back pain: Secondary | ICD-10-CM | POA: Diagnosis not present

## 2018-08-22 DIAGNOSIS — E7849 Other hyperlipidemia: Secondary | ICD-10-CM | POA: Diagnosis not present

## 2018-09-06 DIAGNOSIS — G4733 Obstructive sleep apnea (adult) (pediatric): Secondary | ICD-10-CM | POA: Diagnosis not present

## 2018-10-20 DIAGNOSIS — Z0000 Encounter for general adult medical examination without abnormal findings: Secondary | ICD-10-CM | POA: Diagnosis not present

## 2018-10-20 DIAGNOSIS — N182 Chronic kidney disease, stage 2 (mild): Secondary | ICD-10-CM | POA: Diagnosis not present

## 2018-10-20 DIAGNOSIS — E7849 Other hyperlipidemia: Secondary | ICD-10-CM | POA: Diagnosis not present

## 2018-10-20 DIAGNOSIS — M545 Low back pain: Secondary | ICD-10-CM | POA: Diagnosis not present

## 2018-10-20 DIAGNOSIS — I10 Essential (primary) hypertension: Secondary | ICD-10-CM | POA: Diagnosis not present

## 2018-10-20 DIAGNOSIS — E782 Mixed hyperlipidemia: Secondary | ICD-10-CM | POA: Diagnosis not present

## 2018-11-23 DIAGNOSIS — E782 Mixed hyperlipidemia: Secondary | ICD-10-CM | POA: Diagnosis not present

## 2018-11-23 DIAGNOSIS — I10 Essential (primary) hypertension: Secondary | ICD-10-CM | POA: Diagnosis not present

## 2018-11-23 DIAGNOSIS — M545 Low back pain: Secondary | ICD-10-CM | POA: Diagnosis not present

## 2018-11-23 DIAGNOSIS — N182 Chronic kidney disease, stage 2 (mild): Secondary | ICD-10-CM | POA: Diagnosis not present

## 2019-01-06 DIAGNOSIS — M545 Low back pain: Secondary | ICD-10-CM | POA: Diagnosis not present

## 2019-01-06 DIAGNOSIS — I10 Essential (primary) hypertension: Secondary | ICD-10-CM | POA: Diagnosis not present

## 2019-01-06 DIAGNOSIS — N182 Chronic kidney disease, stage 2 (mild): Secondary | ICD-10-CM | POA: Diagnosis not present

## 2019-01-06 DIAGNOSIS — E782 Mixed hyperlipidemia: Secondary | ICD-10-CM | POA: Diagnosis not present

## 2019-01-23 DIAGNOSIS — Z1389 Encounter for screening for other disorder: Secondary | ICD-10-CM | POA: Diagnosis not present

## 2019-01-23 DIAGNOSIS — L570 Actinic keratosis: Secondary | ICD-10-CM | POA: Diagnosis not present

## 2019-01-23 DIAGNOSIS — N182 Chronic kidney disease, stage 2 (mild): Secondary | ICD-10-CM | POA: Diagnosis not present

## 2019-01-23 DIAGNOSIS — I10 Essential (primary) hypertension: Secondary | ICD-10-CM | POA: Diagnosis not present

## 2019-01-23 DIAGNOSIS — Z0000 Encounter for general adult medical examination without abnormal findings: Secondary | ICD-10-CM | POA: Diagnosis not present

## 2019-01-23 DIAGNOSIS — M545 Low back pain: Secondary | ICD-10-CM | POA: Diagnosis not present

## 2019-01-23 DIAGNOSIS — E782 Mixed hyperlipidemia: Secondary | ICD-10-CM | POA: Diagnosis not present

## 2019-02-09 DIAGNOSIS — X32XXXA Exposure to sunlight, initial encounter: Secondary | ICD-10-CM | POA: Diagnosis not present

## 2019-02-09 DIAGNOSIS — L570 Actinic keratosis: Secondary | ICD-10-CM | POA: Diagnosis not present

## 2019-02-09 DIAGNOSIS — D225 Melanocytic nevi of trunk: Secondary | ICD-10-CM | POA: Diagnosis not present

## 2019-02-09 DIAGNOSIS — L821 Other seborrheic keratosis: Secondary | ICD-10-CM | POA: Diagnosis not present

## 2019-02-09 DIAGNOSIS — L820 Inflamed seborrheic keratosis: Secondary | ICD-10-CM | POA: Diagnosis not present

## 2019-02-10 DIAGNOSIS — M545 Low back pain: Secondary | ICD-10-CM | POA: Diagnosis not present

## 2019-02-10 DIAGNOSIS — E782 Mixed hyperlipidemia: Secondary | ICD-10-CM | POA: Diagnosis not present

## 2019-02-10 DIAGNOSIS — N182 Chronic kidney disease, stage 2 (mild): Secondary | ICD-10-CM | POA: Diagnosis not present

## 2019-02-10 DIAGNOSIS — I10 Essential (primary) hypertension: Secondary | ICD-10-CM | POA: Diagnosis not present

## 2019-02-14 DIAGNOSIS — H903 Sensorineural hearing loss, bilateral: Secondary | ICD-10-CM | POA: Diagnosis not present

## 2019-02-14 DIAGNOSIS — H9113 Presbycusis, bilateral: Secondary | ICD-10-CM | POA: Diagnosis not present

## 2019-02-14 DIAGNOSIS — H9313 Tinnitus, bilateral: Secondary | ICD-10-CM | POA: Diagnosis not present

## 2019-02-14 DIAGNOSIS — H833X3 Noise effects on inner ear, bilateral: Secondary | ICD-10-CM | POA: Diagnosis not present

## 2019-02-20 DIAGNOSIS — H905 Unspecified sensorineural hearing loss: Secondary | ICD-10-CM | POA: Diagnosis not present

## 2019-03-10 DIAGNOSIS — N182 Chronic kidney disease, stage 2 (mild): Secondary | ICD-10-CM | POA: Diagnosis not present

## 2019-03-10 DIAGNOSIS — E782 Mixed hyperlipidemia: Secondary | ICD-10-CM | POA: Diagnosis not present

## 2019-03-10 DIAGNOSIS — I10 Essential (primary) hypertension: Secondary | ICD-10-CM | POA: Diagnosis not present

## 2019-03-10 DIAGNOSIS — M545 Low back pain: Secondary | ICD-10-CM | POA: Diagnosis not present

## 2019-03-15 DIAGNOSIS — Z122 Encounter for screening for malignant neoplasm of respiratory organs: Secondary | ICD-10-CM | POA: Diagnosis not present

## 2019-03-15 DIAGNOSIS — Z87891 Personal history of nicotine dependence: Secondary | ICD-10-CM | POA: Diagnosis not present

## 2019-03-15 DIAGNOSIS — I700 Atherosclerosis of aorta: Secondary | ICD-10-CM | POA: Diagnosis not present

## 2019-03-15 DIAGNOSIS — J439 Emphysema, unspecified: Secondary | ICD-10-CM | POA: Diagnosis not present

## 2019-03-15 DIAGNOSIS — F17210 Nicotine dependence, cigarettes, uncomplicated: Secondary | ICD-10-CM | POA: Diagnosis not present

## 2019-04-06 DIAGNOSIS — M545 Low back pain: Secondary | ICD-10-CM | POA: Diagnosis not present

## 2019-04-06 DIAGNOSIS — I10 Essential (primary) hypertension: Secondary | ICD-10-CM | POA: Diagnosis not present

## 2019-04-06 DIAGNOSIS — N182 Chronic kidney disease, stage 2 (mild): Secondary | ICD-10-CM | POA: Diagnosis not present

## 2019-04-06 DIAGNOSIS — E782 Mixed hyperlipidemia: Secondary | ICD-10-CM | POA: Diagnosis not present

## 2019-04-24 DIAGNOSIS — L570 Actinic keratosis: Secondary | ICD-10-CM | POA: Diagnosis not present

## 2019-04-24 DIAGNOSIS — M545 Low back pain: Secondary | ICD-10-CM | POA: Diagnosis not present

## 2019-04-24 DIAGNOSIS — N182 Chronic kidney disease, stage 2 (mild): Secondary | ICD-10-CM | POA: Diagnosis not present

## 2019-04-24 DIAGNOSIS — Z0000 Encounter for general adult medical examination without abnormal findings: Secondary | ICD-10-CM | POA: Diagnosis not present

## 2019-04-24 DIAGNOSIS — I10 Essential (primary) hypertension: Secondary | ICD-10-CM | POA: Diagnosis not present

## 2019-04-24 DIAGNOSIS — E782 Mixed hyperlipidemia: Secondary | ICD-10-CM | POA: Diagnosis not present

## 2019-04-28 DIAGNOSIS — G4733 Obstructive sleep apnea (adult) (pediatric): Secondary | ICD-10-CM | POA: Diagnosis not present

## 2019-05-04 DIAGNOSIS — I10 Essential (primary) hypertension: Secondary | ICD-10-CM | POA: Diagnosis not present

## 2019-05-04 DIAGNOSIS — M545 Low back pain: Secondary | ICD-10-CM | POA: Diagnosis not present

## 2019-05-04 DIAGNOSIS — N182 Chronic kidney disease, stage 2 (mild): Secondary | ICD-10-CM | POA: Diagnosis not present

## 2019-05-04 DIAGNOSIS — E782 Mixed hyperlipidemia: Secondary | ICD-10-CM | POA: Diagnosis not present

## 2019-05-31 DIAGNOSIS — M545 Low back pain: Secondary | ICD-10-CM | POA: Diagnosis not present

## 2019-05-31 DIAGNOSIS — E782 Mixed hyperlipidemia: Secondary | ICD-10-CM | POA: Diagnosis not present

## 2019-05-31 DIAGNOSIS — I10 Essential (primary) hypertension: Secondary | ICD-10-CM | POA: Diagnosis not present

## 2019-05-31 DIAGNOSIS — N182 Chronic kidney disease, stage 2 (mild): Secondary | ICD-10-CM | POA: Diagnosis not present

## 2019-07-18 DIAGNOSIS — E782 Mixed hyperlipidemia: Secondary | ICD-10-CM | POA: Diagnosis not present

## 2019-07-18 DIAGNOSIS — M545 Low back pain: Secondary | ICD-10-CM | POA: Diagnosis not present

## 2019-07-18 DIAGNOSIS — I10 Essential (primary) hypertension: Secondary | ICD-10-CM | POA: Diagnosis not present

## 2019-07-18 DIAGNOSIS — N182 Chronic kidney disease, stage 2 (mild): Secondary | ICD-10-CM | POA: Diagnosis not present

## 2019-07-24 DIAGNOSIS — E782 Mixed hyperlipidemia: Secondary | ICD-10-CM | POA: Diagnosis not present

## 2019-07-24 DIAGNOSIS — N182 Chronic kidney disease, stage 2 (mild): Secondary | ICD-10-CM | POA: Diagnosis not present

## 2019-07-24 DIAGNOSIS — I10 Essential (primary) hypertension: Secondary | ICD-10-CM | POA: Diagnosis not present

## 2019-07-24 DIAGNOSIS — M545 Low back pain: Secondary | ICD-10-CM | POA: Diagnosis not present

## 2019-08-07 DIAGNOSIS — I10 Essential (primary) hypertension: Secondary | ICD-10-CM | POA: Diagnosis not present

## 2019-08-07 DIAGNOSIS — E782 Mixed hyperlipidemia: Secondary | ICD-10-CM | POA: Diagnosis not present

## 2019-08-07 DIAGNOSIS — N182 Chronic kidney disease, stage 2 (mild): Secondary | ICD-10-CM | POA: Diagnosis not present

## 2019-08-07 DIAGNOSIS — M545 Low back pain: Secondary | ICD-10-CM | POA: Diagnosis not present

## 2019-08-17 DIAGNOSIS — G4733 Obstructive sleep apnea (adult) (pediatric): Secondary | ICD-10-CM | POA: Diagnosis not present

## 2019-09-04 DIAGNOSIS — E782 Mixed hyperlipidemia: Secondary | ICD-10-CM | POA: Diagnosis not present

## 2019-09-04 DIAGNOSIS — M545 Low back pain: Secondary | ICD-10-CM | POA: Diagnosis not present

## 2019-09-04 DIAGNOSIS — I10 Essential (primary) hypertension: Secondary | ICD-10-CM | POA: Diagnosis not present

## 2019-09-04 DIAGNOSIS — N182 Chronic kidney disease, stage 2 (mild): Secondary | ICD-10-CM | POA: Diagnosis not present

## 2019-10-03 DIAGNOSIS — I10 Essential (primary) hypertension: Secondary | ICD-10-CM | POA: Diagnosis not present

## 2019-10-03 DIAGNOSIS — M545 Low back pain: Secondary | ICD-10-CM | POA: Diagnosis not present

## 2019-10-03 DIAGNOSIS — E782 Mixed hyperlipidemia: Secondary | ICD-10-CM | POA: Diagnosis not present

## 2019-10-03 DIAGNOSIS — N182 Chronic kidney disease, stage 2 (mild): Secondary | ICD-10-CM | POA: Diagnosis not present

## 2019-10-24 DIAGNOSIS — E782 Mixed hyperlipidemia: Secondary | ICD-10-CM | POA: Diagnosis not present

## 2019-10-24 DIAGNOSIS — I10 Essential (primary) hypertension: Secondary | ICD-10-CM | POA: Diagnosis not present

## 2019-10-24 DIAGNOSIS — N182 Chronic kidney disease, stage 2 (mild): Secondary | ICD-10-CM | POA: Diagnosis not present

## 2019-10-24 DIAGNOSIS — M545 Low back pain, unspecified: Secondary | ICD-10-CM | POA: Diagnosis not present

## 2019-11-02 DIAGNOSIS — E782 Mixed hyperlipidemia: Secondary | ICD-10-CM | POA: Diagnosis not present

## 2019-11-02 DIAGNOSIS — I10 Essential (primary) hypertension: Secondary | ICD-10-CM | POA: Diagnosis not present

## 2019-11-02 DIAGNOSIS — N182 Chronic kidney disease, stage 2 (mild): Secondary | ICD-10-CM | POA: Diagnosis not present

## 2019-11-30 DIAGNOSIS — N182 Chronic kidney disease, stage 2 (mild): Secondary | ICD-10-CM | POA: Diagnosis not present

## 2019-11-30 DIAGNOSIS — I10 Essential (primary) hypertension: Secondary | ICD-10-CM | POA: Diagnosis not present

## 2019-11-30 DIAGNOSIS — E782 Mixed hyperlipidemia: Secondary | ICD-10-CM | POA: Diagnosis not present

## 2019-12-28 DIAGNOSIS — E782 Mixed hyperlipidemia: Secondary | ICD-10-CM | POA: Diagnosis not present

## 2019-12-28 DIAGNOSIS — N182 Chronic kidney disease, stage 2 (mild): Secondary | ICD-10-CM | POA: Diagnosis not present

## 2019-12-28 DIAGNOSIS — I10 Essential (primary) hypertension: Secondary | ICD-10-CM | POA: Diagnosis not present

## 2020-01-10 DIAGNOSIS — J0191 Acute recurrent sinusitis, unspecified: Secondary | ICD-10-CM | POA: Diagnosis not present

## 2020-04-10 DIAGNOSIS — Z0000 Encounter for general adult medical examination without abnormal findings: Secondary | ICD-10-CM | POA: Diagnosis not present

## 2020-04-12 ENCOUNTER — Encounter (INDEPENDENT_AMBULATORY_CARE_PROVIDER_SITE_OTHER): Payer: Self-pay | Admitting: *Deleted

## 2020-04-26 DIAGNOSIS — J439 Emphysema, unspecified: Secondary | ICD-10-CM | POA: Diagnosis not present

## 2020-04-26 DIAGNOSIS — I2510 Atherosclerotic heart disease of native coronary artery without angina pectoris: Secondary | ICD-10-CM | POA: Diagnosis not present

## 2020-04-26 DIAGNOSIS — F17210 Nicotine dependence, cigarettes, uncomplicated: Secondary | ICD-10-CM | POA: Diagnosis not present

## 2020-04-26 DIAGNOSIS — I700 Atherosclerosis of aorta: Secondary | ICD-10-CM | POA: Diagnosis not present

## 2020-06-05 DIAGNOSIS — H905 Unspecified sensorineural hearing loss: Secondary | ICD-10-CM | POA: Diagnosis not present

## 2020-06-25 ENCOUNTER — Encounter (INDEPENDENT_AMBULATORY_CARE_PROVIDER_SITE_OTHER): Payer: Self-pay

## 2020-07-11 DIAGNOSIS — J449 Chronic obstructive pulmonary disease, unspecified: Secondary | ICD-10-CM | POA: Diagnosis not present

## 2020-07-11 DIAGNOSIS — E782 Mixed hyperlipidemia: Secondary | ICD-10-CM | POA: Diagnosis not present

## 2020-07-11 DIAGNOSIS — I700 Atherosclerosis of aorta: Secondary | ICD-10-CM | POA: Diagnosis not present

## 2020-07-11 DIAGNOSIS — M79672 Pain in left foot: Secondary | ICD-10-CM | POA: Diagnosis not present

## 2020-07-12 DIAGNOSIS — I700 Atherosclerosis of aorta: Secondary | ICD-10-CM | POA: Diagnosis not present

## 2020-07-12 DIAGNOSIS — Z0000 Encounter for general adult medical examination without abnormal findings: Secondary | ICD-10-CM | POA: Diagnosis not present

## 2020-07-12 DIAGNOSIS — E782 Mixed hyperlipidemia: Secondary | ICD-10-CM | POA: Diagnosis not present

## 2020-07-12 DIAGNOSIS — J449 Chronic obstructive pulmonary disease, unspecified: Secondary | ICD-10-CM | POA: Diagnosis not present

## 2020-10-14 DIAGNOSIS — E782 Mixed hyperlipidemia: Secondary | ICD-10-CM | POA: Diagnosis not present

## 2020-10-14 DIAGNOSIS — Z0000 Encounter for general adult medical examination without abnormal findings: Secondary | ICD-10-CM | POA: Diagnosis not present

## 2020-10-14 DIAGNOSIS — J449 Chronic obstructive pulmonary disease, unspecified: Secondary | ICD-10-CM | POA: Diagnosis not present

## 2020-10-14 DIAGNOSIS — M79672 Pain in left foot: Secondary | ICD-10-CM | POA: Diagnosis not present

## 2020-10-14 DIAGNOSIS — N1831 Chronic kidney disease, stage 3a: Secondary | ICD-10-CM | POA: Diagnosis not present

## 2020-10-14 DIAGNOSIS — I700 Atherosclerosis of aorta: Secondary | ICD-10-CM | POA: Diagnosis not present

## 2020-10-14 DIAGNOSIS — I10 Essential (primary) hypertension: Secondary | ICD-10-CM | POA: Diagnosis not present

## 2020-10-15 ENCOUNTER — Other Ambulatory Visit (INDEPENDENT_AMBULATORY_CARE_PROVIDER_SITE_OTHER): Payer: Self-pay

## 2020-10-15 DIAGNOSIS — Z86010 Personal history of colonic polyps: Secondary | ICD-10-CM

## 2020-10-18 DIAGNOSIS — E782 Mixed hyperlipidemia: Secondary | ICD-10-CM | POA: Diagnosis not present

## 2020-10-18 DIAGNOSIS — N1831 Chronic kidney disease, stage 3a: Secondary | ICD-10-CM | POA: Diagnosis not present

## 2020-10-18 DIAGNOSIS — I700 Atherosclerosis of aorta: Secondary | ICD-10-CM | POA: Diagnosis not present

## 2020-10-18 DIAGNOSIS — J449 Chronic obstructive pulmonary disease, unspecified: Secondary | ICD-10-CM | POA: Diagnosis not present

## 2020-10-18 DIAGNOSIS — I10 Essential (primary) hypertension: Secondary | ICD-10-CM | POA: Diagnosis not present

## 2020-10-21 ENCOUNTER — Telehealth (INDEPENDENT_AMBULATORY_CARE_PROVIDER_SITE_OTHER): Payer: Self-pay

## 2020-10-21 ENCOUNTER — Encounter (INDEPENDENT_AMBULATORY_CARE_PROVIDER_SITE_OTHER): Payer: Self-pay

## 2020-10-21 MED ORDER — PEG 3350-KCL-NA BICARB-NACL 420 G PO SOLR: 4000.0000 mL | ORAL | 0 refills | Status: DC

## 2020-10-21 NOTE — Telephone Encounter (Signed)
[name], CMA  

## 2020-10-21 NOTE — Telephone Encounter (Signed)
Referring MD/PCP: Hasanaj  Procedure: Tcs   Reason/Indication:  History of Polyps  Has patient had this procedure before?  Yes   If so, when, by whom and where? 2016 [facility] (Report Scanned)    Is there a family history of colon cancer?  no  Who?  What age when diagnosed?    Is patient diabetic? If yes, Type 1 or Type 2   no      Does patient have prosthetic heart valve or mechanical valve?  no  Do you have a pacemaker/defibrillator?  no  Has patient ever had endocarditis/atrial fibrillation? no  Does patient use oxygen? no  Has patient had joint replacement within last 12 months?  no  Is patient constipated or do they take laxatives? no  Does patient have a history of alcohol/drug use?  no  Have you had a stroke/heart attack last 6 mths? no  Do you take medicine for weight loss?  no  For male patients,: do you still have your menstrual cycle? N/A  Is patient on blood thinner such as Coumadin, Plavix and/or Aspirin? yes  Medications: asa 81 mg daily, tramadol 50 mg tid, simvastatin 20 mg daily, famotidine 40 mg daily, metoprolol 50 mg bid, nortriptyline 25 mg daily  Allergies: NKDA  Medication Adjustment per Dr Jenetta Downer : None  Procedure date & time: Wednesday 11/20/20 11:15

## 2020-10-21 NOTE — Telephone Encounter (Signed)
Ok to schedule.  Thanks,  Tressa Maldonado Castaneda Mayorga, MD Gastroenterology and Hepatology [facility]  

## 2020-11-20 ENCOUNTER — Encounter (HOSPITAL_COMMUNITY)
Admission: RE | Disposition: A | Discharge status: Home / Self Care | Payer: Self-pay | Source: Ambulatory Visit | Attending: Gastroenterology

## 2020-11-20 ENCOUNTER — Other Ambulatory Visit: Payer: Self-pay

## 2020-11-20 ENCOUNTER — Ambulatory Visit (HOSPITAL_COMMUNITY)
Admission: RE | Admit: 2020-11-20 | Discharge: 2020-11-20 | Disposition: A | Discharge status: Home / Self Care | Payer: Medicare Other | Source: Ambulatory Visit | Attending: Gastroenterology | Admitting: Gastroenterology

## 2020-11-20 ENCOUNTER — Encounter (HOSPITAL_COMMUNITY): Payer: Self-pay | Admitting: Gastroenterology

## 2020-11-20 ENCOUNTER — Ambulatory Visit (HOSPITAL_COMMUNITY): Payer: Medicare Other | Admitting: Anesthesiology

## 2020-11-20 DIAGNOSIS — D122 Benign neoplasm of ascending colon: Secondary | ICD-10-CM

## 2020-11-20 DIAGNOSIS — E785 Hyperlipidemia, unspecified: Secondary | ICD-10-CM | POA: Insufficient documentation

## 2020-11-20 DIAGNOSIS — Z86010 Personal history of colonic polyps: Secondary | ICD-10-CM | POA: Diagnosis not present

## 2020-11-20 DIAGNOSIS — F17210 Nicotine dependence, cigarettes, uncomplicated: Secondary | ICD-10-CM | POA: Diagnosis not present

## 2020-11-20 DIAGNOSIS — D120 Benign neoplasm of cecum: Secondary | ICD-10-CM | POA: Diagnosis not present

## 2020-11-20 DIAGNOSIS — I10 Essential (primary) hypertension: Secondary | ICD-10-CM | POA: Diagnosis not present

## 2020-11-20 DIAGNOSIS — K648 Other hemorrhoids: Secondary | ICD-10-CM | POA: Insufficient documentation

## 2020-11-20 DIAGNOSIS — D123 Benign neoplasm of transverse colon: Secondary | ICD-10-CM | POA: Diagnosis not present

## 2020-11-20 DIAGNOSIS — Z79899 Other long term (current) drug therapy: Secondary | ICD-10-CM | POA: Diagnosis not present

## 2020-11-20 DIAGNOSIS — K635 Polyp of colon: Secondary | ICD-10-CM | POA: Diagnosis not present

## 2020-11-20 DIAGNOSIS — Z1211 Encounter for screening for malignant neoplasm of colon: Secondary | ICD-10-CM | POA: Insufficient documentation

## 2020-11-20 DIAGNOSIS — Z09 Encounter for follow-up examination after completed treatment for conditions other than malignant neoplasm: Secondary | ICD-10-CM

## 2020-11-20 DIAGNOSIS — K5730 Diverticulosis of large intestine without perforation or abscess without bleeding: Secondary | ICD-10-CM

## 2020-11-20 HISTORY — PX: BIOPSY: SHX5522

## 2020-11-20 HISTORY — PX: HOT HEMOSTASIS: SHX5433

## 2020-11-20 HISTORY — PX: POLYPECTOMY: SHX5525

## 2020-11-20 HISTORY — PX: COLONOSCOPY WITH PROPOFOL: SHX5780

## 2020-11-20 LAB — HM COLONOSCOPY

## 2020-11-20 SURGERY — COLONOSCOPY WITH PROPOFOL
Anesthesia: General

## 2020-11-20 MED ORDER — LIDOCAINE 2% (20 MG/ML) 5 ML SYRINGE
INTRAMUSCULAR | Status: DC | PRN
  Administered 2020-11-20: 80 mg via INTRAVENOUS

## 2020-11-20 MED ORDER — PROPOFOL 10 MG/ML IV BOLUS
INTRAVENOUS | Status: DC | PRN
  Administered 2020-11-20: 20 mg via INTRAVENOUS
  Administered 2020-11-20: 60 mg via INTRAVENOUS

## 2020-11-20 MED ORDER — LACTATED RINGERS IV SOLN: INTRAVENOUS | Status: DC

## 2020-11-20 MED ORDER — PROPOFOL 500 MG/50ML IV EMUL
INTRAVENOUS | Status: DC | PRN
  Administered 2020-11-20: 200 ug/kg/min via INTRAVENOUS

## 2020-11-20 MED ORDER — LACTATED RINGERS IV SOLN: INTRAVENOUS | Status: DC | PRN

## 2020-11-20 NOTE — Anesthesia Preprocedure Evaluation (Signed)
Anesthesia Evaluation  Patient identified by MRN, date of birth, ID band Patient awake    Reviewed: Allergy & Precautions, H&P , NPO status , Patient's Chart, lab work & pertinent test results, reviewed documented beta blocker date and time   Airway Mallampati: II  TM Distance: >3 FB Neck ROM: full    Dental no notable dental hx.    Pulmonary neg pulmonary ROS, Current Smoker,    Pulmonary exam normal breath sounds clear to auscultation       Cardiovascular Exercise Tolerance: Good hypertension, negative cardio ROS   Rhythm:regular Rate:Normal     Neuro/Psych negative neurological ROS  negative psych ROS   GI/Hepatic negative GI ROS, Neg liver ROS,   Endo/Other  negative endocrine ROS  Renal/GU negative Renal ROS  negative genitourinary   Musculoskeletal   Abdominal   Peds  Hematology negative hematology ROS (+)   Anesthesia Other Findings   Reproductive/Obstetrics negative OB ROS                             Anesthesia Physical Anesthesia Plan  ASA: 2  Anesthesia Plan: General   Post-op Pain Management:    Induction:   PONV Risk Score and Plan: Propofol infusion  Airway Management Planned:   Additional Equipment:   Intra-op Plan:   Post-operative Plan:   Informed Consent: I have reviewed the patients History and Physical, chart, labs and discussed the procedure including the risks, benefits and alternatives for the proposed anesthesia with the patient or authorized representative who has indicated his/her understanding and acceptance.     Dental Advisory Given  Plan Discussed with: CRNA  Anesthesia Plan Comments:         Anesthesia Quick Evaluation  

## 2020-11-20 NOTE — Discharge Instructions (Signed)
You are being discharged to home.  Resume your previous diet.  We are waiting for your pathology results.  Your physician has recommended a repeat colonoscopy for surveillance based on pathology results.  

## 2020-11-20 NOTE — Anesthesia Postprocedure Evaluation (Signed)
Anesthesia Post Note  Patient: [name]  Procedure(s) Performed: COLONOSCOPY WITH PROPOFOL POLYPECTOMY BIOPSY HOT HEMOSTASIS (ARGON PLASMA COAGULATION/BICAP)  Patient location during evaluation: Phase II Anesthesia Type: General Level of consciousness: awake Pain management: pain level controlled Vital Signs Assessment: post-procedure vital signs reviewed and stable Respiratory status: spontaneous breathing and respiratory function stable Cardiovascular status: blood pressure returned to baseline and stable Postop Assessment: no headache and no apparent nausea or vomiting Anesthetic complications: no Comments: Late entry   No notable events documented.   Last Vitals:  Vitals:   11/20/20 1208 11/20/20 1217  BP: 107/62 126/71  Pulse: 63 68  Resp: (!) 21 18  Temp: 36.7 C   SpO2: 100% 100%    Last Pain:  Vitals:   11/20/20 1217  TempSrc:   PainSc: 0-No pain                 [name]

## 2020-11-20 NOTE — Op Note (Signed)
[facility] Patient Name: [name] Procedure Date: 11/20/2020 11:08 AM MRN: [id number] Date of Birth: July 31, 1955 Attending MD: Maylon Peppers ,  CSN: [id number] Age: 65 Admit Type: Outpatient Procedure:                Colonoscopy Indications:              High risk colon cancer surveillance: Personal                            history of colonic polyps Providers:                Maylon Peppers, Lambert Mody, Rosina Lowenstein,                            RN Referring MD:              Medicines:                Monitored Anesthesia Care Complications:            No immediate complications. Estimated Blood Loss:     Estimated blood loss: none. Procedure:                Pre-Anesthesia Assessment:                           - Prior to the procedure, a History and Physical                            was performed, and patient medications, allergies                            and sensitivities were reviewed. The patient's                            tolerance of previous anesthesia was reviewed.                           - The risks and benefits of the procedure and the                            sedation options and risks were discussed with the                            patient. All questions were answered and informed                            consent was obtained.                           - ASA Grade Assessment: II - A patient with mild                            systemic disease.                           After obtaining informed consent, the colonoscope  was passed under direct vision. Throughout the                            procedure, the patient's blood pressure, pulse, and                            oxygen saturations were monitored continuously. The                            [id number]-HQ190L ([id number]) scope was introduced through                            the anus and advanced to the the cecum, identified                            by  appendiceal orifice and ileocecal valve. The                            colonoscopy was performed without difficulty. The                            patient tolerated the procedure well. The quality                            of the bowel preparation was good. Scope In: 11:20:55 AM Scope Out: 12:04:56 PM Scope Withdrawal Time: 0 hours 40 minutes 35 seconds  Total Procedure Duration: 0 hours 44 minutes 1 second  Findings:      The perianal and digital rectal examinations were normal.      A 2 mm polyp was found in the cecum. The polyp was sessile. The polyp       was removed with a cold biopsy forceps. Resection and retrieval were       complete.      Two sessile polyps were found in the ascending colon and cecum. The       polyps were 6 to 12 mm in size. These polyps were removed with a cold       snare. Resection and retrieval were complete.      A 12 mm polyp was found in the ascending colon. The polyp was       carpet-like and flat. Area was successfully injected with 2 mL Eleview       for a lift polypectomy. Imaging was performed using white light and       narrow band imaging to visualize the mucosa and demarcate the polyp site       after injection for EMR purposes. The polyp was removed with a hot       snare. Resection and retrieval were complete.      Four sessile polyps were found in the transverse colon. The polyps were       4 to 6 mm in size. These polyps were removed with a cold snare.       Resection and retrieval were complete.      Multiple small and large-mouthed diverticula were found in the sigmoid       colon and descending colon.      Non-bleeding internal hemorrhoids were found during retroflexion. The  hemorrhoids were small. Impression:               - One 2 mm polyp in the cecum, removed with a cold                            biopsy forceps. Resected and retrieved.                           - Two 6 to 12 mm polyps in the ascending colon and                             in the cecum, removed with a cold snare. Resected                            and retrieved.                           - One 12 mm polyp in the ascending colon, removed                            with a hot snare. Resected and retrieved. Injected.                           - Four 4 to 6 mm polyps in the transverse colon,                            removed with a cold snare. Resected and retrieved.                           - Diverticulosis in the sigmoid colon and in the                            descending colon.                           - Non-bleeding internal hemorrhoids. Moderate Sedation:      Per Anesthesia Care Recommendation:           - Discharge patient to home (ambulatory).                           - Resume previous diet.                           - Await pathology results.                           - Repeat colonoscopy for surveillance based on                            pathology results. Procedure Code(s):        --- Professional ---                           [phone], 63, Colonoscopy, flexible; with removal of  tumor(s), polyp(s), or other lesion(s) by snare                            technique                           45380, Colonoscopy, flexible; with biopsy, single                            or multiple                           45381, Colonoscopy, flexible; with directed                            submucosal injection(s), any substance Diagnosis Code(s):        --- Professional ---                           K63.5, Polyp of colon                           Z86.010, Personal history of colonic polyps                           K64.8, Other hemorrhoids                           K57.30, Diverticulosis of large intestine without                            perforation or abscess without bleeding CPT copyright 2019 American Medical Association. All rights reserved. The codes documented in this report are preliminary and upon coder  review may  be revised to meet current compliance requirements. Maylon Peppers, MD Maylon Peppers,  11/20/2020 12:13:56 PM This report has been signed electronically. Number of Addenda: 0

## 2020-11-20 NOTE — Transfer of Care (Signed)
Immediate Anesthesia Transfer of Care Note  Patient: [name]  Procedure(s) Performed: COLONOSCOPY WITH PROPOFOL POLYPECTOMY BIOPSY HOT HEMOSTASIS (ARGON PLASMA COAGULATION/BICAP)  Patient Location: Endoscopy Unit  Anesthesia Type:MAC  Level of Consciousness: sedated and responds to stimulation  Airway & Oxygen Therapy: Patient Spontanous Breathing and Patient connected to nasal cannula oxygen  Post-op Assessment: Report given to RN and Post -op Vital signs reviewed and stable  Post vital signs: Reviewed and stable  Last Vitals:  Vitals Value Taken Time  BP    Temp    Pulse    Resp    SpO2      Last Pain:  Vitals:   11/20/20 1117  TempSrc:   PainSc: 0-No pain         Complications: No notable events documented.

## 2020-11-20 NOTE — H&P (Signed)
[name] is an 65 y.o. male.   Chief Complaint: History of colon polyps HPI: 65 year old male with past medical history of hyperlipidemia and hypertension, coming for history of colon polyps.  Last colonoscopy was performed in 2016 at [facility], was found to have 1 polyp in the transverse colon which was removed (pathology not available).  He was advised to have a repeat colonoscopy in 5 years.  The patient denies having any complaints such as melena, hematochezia, abdominal pain or distention, change in her bowel movement consistency or frequency, no changes in her weight recently.  No family history of colorectal cancer.   Past Medical History:  Diagnosis Date   Hyperlipidemia    Hypertension     Past Surgical History:  Procedure Laterality Date   ANKLE SURGERY Left    FRACTURE SURGERY     left arm    History reviewed. No pertinent family history. Social History:  reports that he has been smoking cigarettes. He has been smoking an average of 1 pack per day. He has never used smokeless tobacco. He reports that he does not drink alcohol and does not use drugs.  Allergies: No Known Allergies  Medications Prior to Admission  Medication Sig Dispense Refill   famotidine (PEPCID) 40 MG tablet Take 40 mg by mouth daily.     hydrochlorothiazide (HYDRODIURIL) 25 MG tablet Take 25 mg by mouth daily.     metoprolol tartrate (LOPRESSOR) 50 MG tablet Take 50 mg by mouth 2 (two) times daily.  0   nortriptyline (PAMELOR) 25 MG capsule Take 25 mg by mouth at bedtime.  0   omega-3 acid ethyl esters (LOVAZA) 1 g capsule Take 1 capsule by mouth 5 (five) times daily.  0   polyethylene glycol-electrolytes (TRILYTE) 420 g solution Take 4,000 mLs by mouth as directed. 4000 mL 0   simvastatin (ZOCOR) 20 MG tablet Take 20 mg by mouth daily.  0   traMADol (ULTRAM) 50 MG tablet Take 50 mg by mouth 4 (four) times daily.  0    No results found for this or any previous visit (from the past 48  hour(s)). No results found.  Review of Systems  Blood pressure (!) 159/90, pulse 73, temperature 98 F (36.7 C), temperature source Oral, resp. rate 15, height 5\' 6"  (1.676 m), weight 90.3 kg, SpO2 100 %. Physical Exam  GENERAL: The patient is AO x3, in no acute distress. HEENT: Head is normocephalic and atraumatic. EOMI are intact. Mouth is well hydrated and without lesions. NECK: Supple. No masses LUNGS: Clear to auscultation. No presence of rhonchi/wheezing/rales. Adequate chest expansion HEART: RRR, normal s1 and s2. ABDOMEN: Soft, nontender, no guarding, no peritoneal signs, and nondistended. BS +. No masses. EXTREMITIES: Without any cyanosis, clubbing, rash, lesions or edema. NEUROLOGIC: AOx3, no focal motor deficit. SKIN: no jaundice, no rashes  Assessment/Plan 65 year old male with past medical history of hyperlipidemia and hypertension, coming for history of colon polyps.  We will proceed with colonoscopy today.   Harvel Quale, MD 11/20/2020, 10:26 AM

## 2020-11-21 LAB — SURGICAL PATHOLOGY

## 2020-11-22 ENCOUNTER — Encounter (INDEPENDENT_AMBULATORY_CARE_PROVIDER_SITE_OTHER): Payer: Self-pay | Admitting: *Deleted

## 2020-11-25 ENCOUNTER — Encounter (HOSPITAL_COMMUNITY): Payer: Self-pay | Admitting: Gastroenterology

## 2021-01-14 DIAGNOSIS — N1831 Chronic kidney disease, stage 3a: Secondary | ICD-10-CM | POA: Diagnosis not present

## 2021-01-14 DIAGNOSIS — E782 Mixed hyperlipidemia: Secondary | ICD-10-CM | POA: Diagnosis not present

## 2021-01-14 DIAGNOSIS — M79672 Pain in left foot: Secondary | ICD-10-CM | POA: Diagnosis not present

## 2021-01-14 DIAGNOSIS — I10 Essential (primary) hypertension: Secondary | ICD-10-CM | POA: Diagnosis not present

## 2021-01-14 DIAGNOSIS — I700 Atherosclerosis of aorta: Secondary | ICD-10-CM | POA: Diagnosis not present

## 2021-01-17 DIAGNOSIS — N1831 Chronic kidney disease, stage 3a: Secondary | ICD-10-CM | POA: Diagnosis not present

## 2021-01-17 DIAGNOSIS — I700 Atherosclerosis of aorta: Secondary | ICD-10-CM | POA: Diagnosis not present

## 2021-01-17 DIAGNOSIS — I10 Essential (primary) hypertension: Secondary | ICD-10-CM | POA: Diagnosis not present

## 2021-01-17 DIAGNOSIS — E782 Mixed hyperlipidemia: Secondary | ICD-10-CM | POA: Diagnosis not present

## 2021-04-14 DIAGNOSIS — I700 Atherosclerosis of aorta: Secondary | ICD-10-CM | POA: Diagnosis not present

## 2021-04-14 DIAGNOSIS — N1831 Chronic kidney disease, stage 3a: Secondary | ICD-10-CM | POA: Diagnosis not present

## 2021-04-14 DIAGNOSIS — I10 Essential (primary) hypertension: Secondary | ICD-10-CM | POA: Diagnosis not present

## 2021-04-14 DIAGNOSIS — M79672 Pain in left foot: Secondary | ICD-10-CM | POA: Diagnosis not present

## 2021-04-14 DIAGNOSIS — E782 Mixed hyperlipidemia: Secondary | ICD-10-CM | POA: Diagnosis not present

## 2021-04-28 DIAGNOSIS — Z122 Encounter for screening for malignant neoplasm of respiratory organs: Secondary | ICD-10-CM | POA: Diagnosis not present

## 2021-04-28 DIAGNOSIS — Z87891 Personal history of nicotine dependence: Secondary | ICD-10-CM | POA: Diagnosis not present

## 2021-04-28 DIAGNOSIS — F17210 Nicotine dependence, cigarettes, uncomplicated: Secondary | ICD-10-CM | POA: Diagnosis not present

## 2021-06-02 DIAGNOSIS — H905 Unspecified sensorineural hearing loss: Secondary | ICD-10-CM | POA: Diagnosis not present

## 2021-07-14 DIAGNOSIS — N1831 Chronic kidney disease, stage 3a: Secondary | ICD-10-CM | POA: Diagnosis not present

## 2021-07-14 DIAGNOSIS — I700 Atherosclerosis of aorta: Secondary | ICD-10-CM | POA: Diagnosis not present

## 2021-07-14 DIAGNOSIS — Z0000 Encounter for general adult medical examination without abnormal findings: Secondary | ICD-10-CM | POA: Diagnosis not present

## 2021-07-14 DIAGNOSIS — I10 Essential (primary) hypertension: Secondary | ICD-10-CM | POA: Diagnosis not present

## 2021-07-14 DIAGNOSIS — E782 Mixed hyperlipidemia: Secondary | ICD-10-CM | POA: Diagnosis not present

## 2021-07-18 DIAGNOSIS — N1831 Chronic kidney disease, stage 3a: Secondary | ICD-10-CM | POA: Diagnosis not present

## 2021-07-18 DIAGNOSIS — I700 Atherosclerosis of aorta: Secondary | ICD-10-CM | POA: Diagnosis not present

## 2021-07-18 DIAGNOSIS — I10 Essential (primary) hypertension: Secondary | ICD-10-CM | POA: Diagnosis not present

## 2021-07-18 DIAGNOSIS — Z0000 Encounter for general adult medical examination without abnormal findings: Secondary | ICD-10-CM | POA: Diagnosis not present

## 2021-10-13 DIAGNOSIS — I700 Atherosclerosis of aorta: Secondary | ICD-10-CM | POA: Diagnosis not present

## 2021-10-13 DIAGNOSIS — E782 Mixed hyperlipidemia: Secondary | ICD-10-CM | POA: Diagnosis not present

## 2021-10-13 DIAGNOSIS — I10 Essential (primary) hypertension: Secondary | ICD-10-CM | POA: Diagnosis not present

## 2021-10-13 DIAGNOSIS — Z0000 Encounter for general adult medical examination without abnormal findings: Secondary | ICD-10-CM | POA: Diagnosis not present

## 2021-10-13 DIAGNOSIS — N1831 Chronic kidney disease, stage 3a: Secondary | ICD-10-CM | POA: Diagnosis not present

## 2021-10-23 DIAGNOSIS — Z13820 Encounter for screening for osteoporosis: Secondary | ICD-10-CM | POA: Diagnosis not present

## 2021-10-23 DIAGNOSIS — M810 Age-related osteoporosis without current pathological fracture: Secondary | ICD-10-CM | POA: Diagnosis not present

## 2022-01-05 DIAGNOSIS — M79672 Pain in left foot: Secondary | ICD-10-CM | POA: Diagnosis not present

## 2022-01-05 DIAGNOSIS — I700 Atherosclerosis of aorta: Secondary | ICD-10-CM | POA: Diagnosis not present

## 2022-01-05 DIAGNOSIS — E782 Mixed hyperlipidemia: Secondary | ICD-10-CM | POA: Diagnosis not present

## 2022-01-05 DIAGNOSIS — I10 Essential (primary) hypertension: Secondary | ICD-10-CM | POA: Diagnosis not present

## 2022-01-05 DIAGNOSIS — N1831 Chronic kidney disease, stage 3a: Secondary | ICD-10-CM | POA: Diagnosis not present

## 2022-04-06 DIAGNOSIS — Z0000 Encounter for general adult medical examination without abnormal findings: Secondary | ICD-10-CM | POA: Diagnosis not present

## 2022-04-06 DIAGNOSIS — I700 Atherosclerosis of aorta: Secondary | ICD-10-CM | POA: Diagnosis not present

## 2022-04-06 DIAGNOSIS — N1831 Chronic kidney disease, stage 3a: Secondary | ICD-10-CM | POA: Diagnosis not present

## 2022-04-06 DIAGNOSIS — I10 Essential (primary) hypertension: Secondary | ICD-10-CM | POA: Diagnosis not present

## 2022-04-06 DIAGNOSIS — E782 Mixed hyperlipidemia: Secondary | ICD-10-CM | POA: Diagnosis not present

## 2022-04-06 DIAGNOSIS — M79672 Pain in left foot: Secondary | ICD-10-CM | POA: Diagnosis not present

## 2022-04-07 ENCOUNTER — Other Ambulatory Visit (HOSPITAL_COMMUNITY): Payer: Self-pay | Admitting: Internal Medicine

## 2022-04-07 DIAGNOSIS — F17209 Nicotine dependence, unspecified, with unspecified nicotine-induced disorders: Secondary | ICD-10-CM

## 2022-04-20 DIAGNOSIS — N1831 Chronic kidney disease, stage 3a: Secondary | ICD-10-CM | POA: Diagnosis not present

## 2022-04-20 DIAGNOSIS — I700 Atherosclerosis of aorta: Secondary | ICD-10-CM | POA: Diagnosis not present

## 2022-04-20 DIAGNOSIS — I10 Essential (primary) hypertension: Secondary | ICD-10-CM | POA: Diagnosis not present

## 2022-04-20 DIAGNOSIS — E782 Mixed hyperlipidemia: Secondary | ICD-10-CM | POA: Diagnosis not present

## 2022-04-20 DIAGNOSIS — Z0000 Encounter for general adult medical examination without abnormal findings: Secondary | ICD-10-CM | POA: Diagnosis not present

## 2022-05-25 ENCOUNTER — Encounter (HOSPITAL_COMMUNITY): Payer: Self-pay

## 2022-05-25 ENCOUNTER — Ambulatory Visit (HOSPITAL_COMMUNITY): Admission: RE | Admit: 2022-05-25 | Payer: 59 | Source: Ambulatory Visit

## 2022-06-09 DIAGNOSIS — Z122 Encounter for screening for malignant neoplasm of respiratory organs: Secondary | ICD-10-CM | POA: Diagnosis not present

## 2022-06-09 DIAGNOSIS — F17210 Nicotine dependence, cigarettes, uncomplicated: Secondary | ICD-10-CM | POA: Diagnosis not present

## 2022-07-06 DIAGNOSIS — J449 Chronic obstructive pulmonary disease, unspecified: Secondary | ICD-10-CM | POA: Diagnosis not present

## 2022-07-06 DIAGNOSIS — M79672 Pain in left foot: Secondary | ICD-10-CM | POA: Diagnosis not present

## 2022-07-06 DIAGNOSIS — I10 Essential (primary) hypertension: Secondary | ICD-10-CM | POA: Diagnosis not present

## 2022-07-06 DIAGNOSIS — I700 Atherosclerosis of aorta: Secondary | ICD-10-CM | POA: Diagnosis not present

## 2022-07-06 DIAGNOSIS — Z0000 Encounter for general adult medical examination without abnormal findings: Secondary | ICD-10-CM | POA: Diagnosis not present

## 2022-07-06 DIAGNOSIS — N182 Chronic kidney disease, stage 2 (mild): Secondary | ICD-10-CM | POA: Diagnosis not present

## 2022-07-06 DIAGNOSIS — E782 Mixed hyperlipidemia: Secondary | ICD-10-CM | POA: Diagnosis not present

## 2022-09-17 DIAGNOSIS — E782 Mixed hyperlipidemia: Secondary | ICD-10-CM | POA: Diagnosis not present

## 2022-09-17 DIAGNOSIS — N182 Chronic kidney disease, stage 2 (mild): Secondary | ICD-10-CM | POA: Diagnosis not present

## 2022-09-17 DIAGNOSIS — I700 Atherosclerosis of aorta: Secondary | ICD-10-CM | POA: Diagnosis not present

## 2022-09-17 DIAGNOSIS — J449 Chronic obstructive pulmonary disease, unspecified: Secondary | ICD-10-CM | POA: Diagnosis not present

## 2022-09-17 DIAGNOSIS — I10 Essential (primary) hypertension: Secondary | ICD-10-CM | POA: Diagnosis not present

## 2022-10-05 DIAGNOSIS — I10 Essential (primary) hypertension: Secondary | ICD-10-CM | POA: Diagnosis not present

## 2022-10-05 DIAGNOSIS — I700 Atherosclerosis of aorta: Secondary | ICD-10-CM | POA: Diagnosis not present

## 2022-10-05 DIAGNOSIS — E782 Mixed hyperlipidemia: Secondary | ICD-10-CM | POA: Diagnosis not present

## 2022-10-05 DIAGNOSIS — N182 Chronic kidney disease, stage 2 (mild): Secondary | ICD-10-CM | POA: Diagnosis not present

## 2022-10-05 DIAGNOSIS — J449 Chronic obstructive pulmonary disease, unspecified: Secondary | ICD-10-CM | POA: Diagnosis not present

## 2022-10-13 ENCOUNTER — Encounter (INDEPENDENT_AMBULATORY_CARE_PROVIDER_SITE_OTHER): Payer: Self-pay | Admitting: *Deleted

## 2023-01-04 DIAGNOSIS — N182 Chronic kidney disease, stage 2 (mild): Secondary | ICD-10-CM | POA: Diagnosis not present

## 2023-01-04 DIAGNOSIS — E782 Mixed hyperlipidemia: Secondary | ICD-10-CM | POA: Diagnosis not present

## 2023-01-04 DIAGNOSIS — J449 Chronic obstructive pulmonary disease, unspecified: Secondary | ICD-10-CM | POA: Diagnosis not present

## 2023-01-04 DIAGNOSIS — I700 Atherosclerosis of aorta: Secondary | ICD-10-CM | POA: Diagnosis not present

## 2023-01-04 DIAGNOSIS — I10 Essential (primary) hypertension: Secondary | ICD-10-CM | POA: Diagnosis not present

## 2023-04-05 DIAGNOSIS — I10 Essential (primary) hypertension: Secondary | ICD-10-CM | POA: Diagnosis not present

## 2023-04-05 DIAGNOSIS — N1831 Chronic kidney disease, stage 3a: Secondary | ICD-10-CM | POA: Diagnosis not present

## 2023-04-05 DIAGNOSIS — E782 Mixed hyperlipidemia: Secondary | ICD-10-CM | POA: Diagnosis not present

## 2023-04-05 DIAGNOSIS — J449 Chronic obstructive pulmonary disease, unspecified: Secondary | ICD-10-CM | POA: Diagnosis not present

## 2023-04-05 DIAGNOSIS — Z0000 Encounter for general adult medical examination without abnormal findings: Secondary | ICD-10-CM | POA: Diagnosis not present

## 2023-04-05 DIAGNOSIS — M5459 Other low back pain: Secondary | ICD-10-CM | POA: Diagnosis not present

## 2023-04-05 DIAGNOSIS — I700 Atherosclerosis of aorta: Secondary | ICD-10-CM | POA: Diagnosis not present

## 2023-04-14 ENCOUNTER — Encounter (INDEPENDENT_AMBULATORY_CARE_PROVIDER_SITE_OTHER): Payer: Self-pay | Admitting: *Deleted

## 2023-04-23 ENCOUNTER — Telehealth (INDEPENDENT_AMBULATORY_CARE_PROVIDER_SITE_OTHER): Payer: Self-pay | Admitting: Gastroenterology

## 2023-04-23 DIAGNOSIS — Z860100 Personal history of colon polyps, unspecified: Secondary | ICD-10-CM

## 2023-04-23 NOTE — Telephone Encounter (Signed)
 Who is your primary care physician: Dr.Hasanaj  Reasons for the colonoscopy:   Have you had a colonoscopy before?  Yes 20222  Do you have family history of colon cancer? no  Previous colonoscopy with polyps removed? Yes 2022  Do you have a history colorectal cancer?   no  Are you diabetic? If yes, Type 1 or Type 2?    no  Do you have a prosthetic or mechanical heart valve? no  Do you have a pacemaker/defibrillator?   no  Have you had endocarditis/atrial fibrillation? no  Have you had joint replacement within the last 12 months?  no  Do you tend to be constipated or have to use laxatives? no  Do you have any history of drugs or alchohol?  no  Do you use supplemental oxygen?  no  Have you had a stroke or heart attack within the last 6 months? no  Do you take weight loss medication?  no      Do you take any blood-thinning medications such as: (aspirin, warfarin, Plavix, Aggrenox)  no  If yes we need the name, milligram, dosage and who is prescribing doctor  Current Outpatient Medications on File Prior to Visit  Medication Sig Dispense Refill   famotidine (PEPCID) 40 MG tablet Take 40 mg by mouth daily.     hydrochlorothiazide (HYDRODIURIL) 25 MG tablet Take 25 mg by mouth daily. (Patient not taking: Reported on 04/23/2023)     metoprolol tartrate (LOPRESSOR) 50 MG tablet Take 50 mg by mouth 2 (two) times daily.  0   nortriptyline (PAMELOR) 25 MG capsule Take 25 mg by mouth at bedtime.  0   omega-3 acid ethyl esters (LOVAZA) 1 g capsule Take 1 capsule by mouth 5 (five) times daily. (Patient not taking: Reported on 04/23/2023)  0   simvastatin (ZOCOR) 20 MG tablet Take 20 mg by mouth daily.  0   traMADol (ULTRAM) 50 MG tablet Take 50 mg by mouth 4 (four) times daily.  0   No current facility-administered medications on file prior to visit.    No Known Allergies   Pharmacy: [facility]  Primary Insurance Name: [facility]  Best number where you can be reached: [phone]

## 2023-04-23 NOTE — Telephone Encounter (Signed)
 Room 1 Thanks

## 2023-04-26 MED ORDER — PEG 3350-KCL-NA BICARB-NACL 420 G PO SOLR: 4000.0000 mL | Freq: Once | ORAL | 0 refills | Status: AC

## 2023-04-26 NOTE — Addendum Note (Signed)
 Addended by: Marlowe Shores on: 04/26/2023 01:18 PM   Modules accepted: Orders

## 2023-04-26 NOTE — Telephone Encounter (Signed)
 Pt left message returning call. Pt scheduled. Prep sent to pharmacy. Instructions mailed to pt. No pa needed per [facility]

## 2023-04-26 NOTE — Telephone Encounter (Signed)
 Left message to return call

## 2023-04-27 ENCOUNTER — Encounter (INDEPENDENT_AMBULATORY_CARE_PROVIDER_SITE_OTHER): Payer: Self-pay | Admitting: *Deleted

## 2023-04-27 NOTE — Telephone Encounter (Signed)
 ref

## 2023-05-05 ENCOUNTER — Other Ambulatory Visit (HOSPITAL_COMMUNITY)
Admission: RE | Admit: 2023-05-05 | Discharge: 2023-05-05 | Disposition: A | Discharge status: Home / Self Care | Source: Ambulatory Visit | Attending: Gastroenterology | Admitting: Gastroenterology

## 2023-05-05 DIAGNOSIS — Z01812 Encounter for preprocedural laboratory examination: Secondary | ICD-10-CM | POA: Insufficient documentation

## 2023-05-05 DIAGNOSIS — Z860100 Personal history of colon polyps, unspecified: Secondary | ICD-10-CM | POA: Diagnosis not present

## 2023-05-05 LAB — BASIC METABOLIC PANEL WITH GFR
Anion gap: 14 (ref 5–15)
BUN: 19 mg/dL (ref 8–23)
CO2: 24 mmol/L (ref 22–32)
Calcium: 9.8 mg/dL (ref 8.9–10.3)
Chloride: 100 mmol/L (ref 98–111)
Creatinine, Ser: 1.47 mg/dL — ABNORMAL HIGH (ref 0.61–1.24)
GFR, Estimated: 52 mL/min — ABNORMAL LOW (ref >60)
Glucose, Bld: 105 mg/dL — ABNORMAL HIGH (ref 70–99)
Potassium: 4.4 mmol/L (ref 3.5–5.1)
Sodium: 138 mmol/L (ref 135–145)

## 2023-05-12 ENCOUNTER — Telehealth (INDEPENDENT_AMBULATORY_CARE_PROVIDER_SITE_OTHER): Payer: Self-pay | Admitting: *Deleted

## 2023-05-12 ENCOUNTER — Ambulatory Visit (HOSPITAL_COMMUNITY)
Admission: RE | Admit: 2023-05-12 | Discharge: 2023-05-12 | Disposition: A | Discharge status: Home / Self Care | Attending: Gastroenterology | Admitting: Gastroenterology

## 2023-05-12 ENCOUNTER — Ambulatory Visit (HOSPITAL_COMMUNITY): Admitting: Anesthesiology

## 2023-05-12 ENCOUNTER — Other Ambulatory Visit: Payer: Self-pay

## 2023-05-12 ENCOUNTER — Encounter (HOSPITAL_COMMUNITY): Payer: Self-pay | Admitting: Gastroenterology

## 2023-05-12 ENCOUNTER — Encounter (HOSPITAL_COMMUNITY)
Admission: RE | Disposition: A | Discharge status: Home / Self Care | Payer: Self-pay | Source: Home / Self Care | Attending: Gastroenterology

## 2023-05-12 DIAGNOSIS — K648 Other hemorrhoids: Secondary | ICD-10-CM | POA: Diagnosis not present

## 2023-05-12 DIAGNOSIS — K649 Unspecified hemorrhoids: Secondary | ICD-10-CM | POA: Diagnosis not present

## 2023-05-12 DIAGNOSIS — Z1211 Encounter for screening for malignant neoplasm of colon: Secondary | ICD-10-CM

## 2023-05-12 DIAGNOSIS — K635 Polyp of colon: Secondary | ICD-10-CM

## 2023-05-12 DIAGNOSIS — D123 Benign neoplasm of transverse colon: Secondary | ICD-10-CM | POA: Diagnosis not present

## 2023-05-12 DIAGNOSIS — K621 Rectal polyp: Secondary | ICD-10-CM

## 2023-05-12 DIAGNOSIS — F17210 Nicotine dependence, cigarettes, uncomplicated: Secondary | ICD-10-CM | POA: Diagnosis not present

## 2023-05-12 DIAGNOSIS — Z860101 Personal history of adenomatous and serrated colon polyps: Secondary | ICD-10-CM

## 2023-05-12 DIAGNOSIS — Z860100 Personal history of colon polyps, unspecified: Secondary | ICD-10-CM

## 2023-05-12 DIAGNOSIS — D125 Benign neoplasm of sigmoid colon: Secondary | ICD-10-CM

## 2023-05-12 DIAGNOSIS — K219 Gastro-esophageal reflux disease without esophagitis: Secondary | ICD-10-CM | POA: Diagnosis not present

## 2023-05-12 DIAGNOSIS — D122 Benign neoplasm of ascending colon: Secondary | ICD-10-CM | POA: Insufficient documentation

## 2023-05-12 DIAGNOSIS — Z79899 Other long term (current) drug therapy: Secondary | ICD-10-CM | POA: Insufficient documentation

## 2023-05-12 DIAGNOSIS — I10 Essential (primary) hypertension: Secondary | ICD-10-CM | POA: Insufficient documentation

## 2023-05-12 HISTORY — PX: COLONOSCOPY: SHX5424

## 2023-05-12 HISTORY — DX: Gastro-esophageal reflux disease without esophagitis: K21.9

## 2023-05-12 LAB — HM COLONOSCOPY

## 2023-05-12 SURGERY — COLONOSCOPY
Anesthesia: General

## 2023-05-12 MED ORDER — LACTATED RINGERS IV SOLN: INTRAVENOUS | Status: DC

## 2023-05-12 MED ORDER — LIDOCAINE 2% (20 MG/ML) 5 ML SYRINGE
INTRAMUSCULAR | Status: DC | PRN
  Administered 2023-05-12: 60 mg via INTRAVENOUS

## 2023-05-12 MED ORDER — PROPOFOL 10 MG/ML IV BOLUS
INTRAVENOUS | Status: DC | PRN
  Administered 2023-05-12: 70 mg via INTRAVENOUS
  Administered 2023-05-12: 100 ug/kg/min via INTRAVENOUS

## 2023-05-12 MED ORDER — PHENYLEPHRINE 80 MCG/ML (10ML) SYRINGE FOR IV PUSH (FOR BLOOD PRESSURE SUPPORT)
PREFILLED_SYRINGE | INTRAVENOUS | Status: DC | PRN
Start: 2023-05-12 — End: 2023-05-12
  Administered 2023-05-12: 40 ug via INTRAVENOUS
  Administered 2023-05-12 (×2): 80 ug via INTRAVENOUS

## 2023-05-12 NOTE — Anesthesia Preprocedure Evaluation (Signed)
 Anesthesia Evaluation  Patient identified by MRN, date of birth, ID band Patient awake    Reviewed: Allergy & Precautions, H&P , NPO status , Patient's Chart, lab work & pertinent test results, reviewed documented beta blocker date and time   Airway Mallampati: II  TM Distance: >3 FB Neck ROM: full    Dental no notable dental hx.    Pulmonary neg pulmonary ROS, Current Smoker and Patient abstained from smoking.   Pulmonary exam normal breath sounds clear to auscultation       Cardiovascular Exercise Tolerance: Good hypertension,  Rhythm:regular Rate:Normal     Neuro/Psych negative neurological ROS  negative psych ROS   GI/Hepatic Neg liver ROS,GERD  ,,  Endo/Other  negative endocrine ROS    Renal/GU negative Renal ROS  negative genitourinary   Musculoskeletal   Abdominal   Peds  Hematology negative hematology ROS (+)   Anesthesia Other Findings   Reproductive/Obstetrics negative OB ROS                             Anesthesia Physical Anesthesia Plan  ASA: 2  Anesthesia Plan: General   Post-op Pain Management:    Induction:   PONV Risk Score and Plan: Propofol  infusion  Airway Management Planned:   Additional Equipment:   Intra-op Plan:   Post-operative Plan:   Informed Consent: I have reviewed the patients History and Physical, chart, labs and discussed the procedure including the risks, benefits and alternatives for the proposed anesthesia with the patient or authorized representative who has indicated his/her understanding and acceptance.     Dental Advisory Given  Plan Discussed with: CRNA  Anesthesia Plan Comments:        Anesthesia Quick Evaluation

## 2023-05-12 NOTE — H&P (Signed)
 [name] is an 68 y.o. male.   Chief Complaint: History of colon polyps HPI: 68 year old male with past medical history of GERD, hyperlipidemia, hypertension, coming for history of colonic polyps.  Last colonoscopy in 2022, had 8 tubular adenomas.  The patient denies having any complaints such as melena, hematochezia, abdominal pain or distention, change in her bowel movement consistency or frequency, no changes in weight recently.  No family history of colorectal cancer.   Past Medical History:  Diagnosis Date   GERD (gastroesophageal reflux disease)    Hyperlipidemia    Hypertension     Past Surgical History:  Procedure Laterality Date   ANKLE SURGERY Left    BIOPSY  11/20/2020   Procedure: BIOPSY;  Surgeon: Urban Garden, MD;  Location: [facility];  Service: Gastroenterology;;   COLONOSCOPY WITH PROPOFOL  N/A 11/20/2020   Procedure: COLONOSCOPY WITH PROPOFOL ;  Surgeon: Urban Garden, MD;  Location: [facility];  Service: Gastroenterology;  Laterality: N/A;  11:15   FRACTURE SURGERY     left arm   HOT HEMOSTASIS  11/20/2020   Procedure: HOT HEMOSTASIS (ARGON PLASMA COAGULATION/BICAP);  Surgeon: Umberto Ganong, Bearl Limes, MD;  Location: [facility];  Service: Gastroenterology;;   POLYPECTOMY  11/20/2020   Procedure: POLYPECTOMY;  Surgeon: Urban Garden, MD;  Location: [facility];  Service: Gastroenterology;;    History reviewed. No pertinent family history. Social History:  reports that he has been smoking cigarettes. He has never used smokeless tobacco. He reports that he does not drink alcohol and does not use drugs.  Allergies: No Known Allergies  Medications Prior to Admission  Medication Sig Dispense Refill   famotidine (PEPCID) 40 MG tablet Take 40 mg by mouth daily.     metoprolol tartrate (LOPRESSOR) 50 MG tablet Take 50 mg by mouth 2 (two) times daily.  0   nortriptyline (PAMELOR) 25 MG capsule Take 25 mg by mouth at  bedtime.  0   simvastatin (ZOCOR) 20 MG tablet Take 20 mg by mouth daily.  0   traMADol (ULTRAM) 50 MG tablet Take 50 mg by mouth 4 (four) times daily.  0   hydrochlorothiazide (HYDRODIURIL) 25 MG tablet Take 25 mg by mouth daily. (Patient not taking: Reported on 04/23/2023)     omega-3 acid ethyl esters (LOVAZA) 1 g capsule Take 1 capsule by mouth 5 (five) times daily. (Patient not taking: Reported on 04/23/2023)  0    No results found for this or any previous visit (from the past 48 hours). No results found.  Review of Systems  All other systems reviewed and are negative.   Blood pressure (!) 144/88, pulse (!) 55, temperature 98.2 F (36.8 C), temperature source Oral, resp. rate 20, height 5\' 6"  (1.676 m), weight 93.4 kg, SpO2 100%. Physical Exam  GENERAL: The patient is AO x3, in no acute distress. HEENT: Head is normocephalic and atraumatic. EOMI are intact. Mouth is well hydrated and without lesions. NECK: Supple. No masses LUNGS: Clear to auscultation. No presence of rhonchi/wheezing/rales. Adequate chest expansion HEART: RRR, normal s1 and s2. ABDOMEN: Soft, nontender, no guarding, no peritoneal signs, and nondistended. BS +. No masses. EXTREMITIES: Without any cyanosis, clubbing, rash, lesions or edema. NEUROLOGIC: AOx3, no focal motor deficit. SKIN: no jaundice, no rashes  Assessment/Plan 68 year old male with past medical history of GERD, hyperlipidemia, hypertension, coming for history of colonic polyps.  Will proceed with colonoscopy.  Urban Garden, MD 05/12/2023, 10:50 AM

## 2023-05-12 NOTE — Telephone Encounter (Signed)
 Referral sent, they will contact patient with apt

## 2023-05-12 NOTE — Op Note (Signed)
 [facility] Patient Name: [name] Procedure Date: 05/12/2023 10:40 AM MRN: [id number] Date of Birth: 05-08-1955 Attending MD: Samantha Cress , , [id number] CSN: [id number] Age: 68 Admit Type: Outpatient Procedure:                Colonoscopy Indications:              Surveillance: Personal history of adenomatous                            polyps on last colonoscopy 3 years ago Providers:                Samantha Cress, Shelvy Dickens A. Hazeline Lister RN, RN, Annell Barrow Referring MD:              Medicines:                Monitored Anesthesia Care Complications:            No immediate complications., Estimated Blood Loss:     Estimated blood loss: none. Procedure:                Pre-Anesthesia Assessment:                           - Prior to the procedure, a History and Physical                            was performed, and patient medications, allergies                            and sensitivities were reviewed. The patient's                            tolerance of previous anesthesia was reviewed.                           - The risks and benefits of the procedure and the                            sedation options and risks were discussed with the                            patient. All questions were answered and informed                            consent was obtained.                           - ASA Grade Assessment: II - A patient with mild                            systemic disease.                           After obtaining informed consent, the colonoscope  was passed under direct vision. Throughout the                            procedure, the patient's blood pressure, pulse, and                            oxygen saturations were monitored continuously. The                            [id number]-HQ190L ([id number]) scope was introduced through                            the anus and advanced to the the cecum, identified                             by appendiceal orifice and ileocecal valve. The                            colonoscopy was performed without difficulty. The                            patient tolerated the procedure well. The quality                            of the bowel preparation was adequate.                           22 Modifier was used because two large EMRs had to                            be perfomed, and pateint had extensive                            intraprocedural time for removal of >20 polyps. Scope In: 10:58:39 AM Scope Out: 11:43:08 AM Scope Withdrawal Time: 0 hours 36 minutes 19 seconds  Total Procedure Duration: 0 hours 44 minutes 29 seconds  Findings:      The perianal and digital rectal examinations were normal.      A 15 mm polyp was found in the ascending colon. The polyp was flat. Area       was successfully injected with 1 mL Eleview for a lift polypectomy.       Imaging was performed using white light and narrow band imaging to       visualize the mucosa and demarcate the polyp site after injection for       EMR purposes. The polyp was removed with a hot snare. Resection and       retrieval were complete.      Ten sessile polyps were found in the transverse colon and ascending       colon. The polyps were 2 to 10 mm in size. These polyps were removed       with a cold snare. Resection and retrieval were complete.      Twelve sessile polyps were found in the sigmoid colon and descending       colon. The polyps were 2 to  8 mm in size. These polyps were removed with       a cold snare. Resection and retrieval were complete.      A 10 mm polyp was found in the descending colon. The polyp was flat.       Area was successfully injected with 1 mL Eleview for a lift polypectomy.       Imaging was performed using white light and narrow band imaging to       visualize the mucosa and demarcate the polyp site after injection for       EMR purposes. The polyp was removed with a cold  snare. Resection and       retrieval were complete.      Non-bleeding internal hemorrhoids were found during retroflexion. The       hemorrhoids were small. Impression:               - One 15 mm polyp in the ascending colon, removed                            with a hot snare. Resected and retrieved via EMR.                           - Ten 2 to 10 mm polyps in the transverse colon and                            in the ascending colon, removed with a cold snare.                            Resected and retrieved.                           - Twelve 2 to 8 mm polyps in the sigmoid colon and                            in the descending colon, removed with a cold snare.                            Resected and retrieved.                           - One 10 mm polyp in the descending colon, removed                            with a cold snare. Resected and retrieved via EMR.                           - Non-bleeding internal hemorrhoids. Moderate Sedation:      Per Anesthesia Care Recommendation:           - Discharge patient to home (ambulatory).                           - Resume previous diet.                           - Await  pathology results.                           - Repeat colonoscopy in 1 year for surveillance.                           - Will refer to genetic counselor given colonic                            polyposis Procedure Code(s):        --- Professional ---                           [id number], 22,59, Colonoscopy, flexible; with removal                            of tumor(s), polyp(s), or other lesion(s) by snare                            technique                           45381, Colonoscopy, flexible; with directed                            submucosal injection(s), any substance Diagnosis Code(s):        --- Professional ---                           Z86.010, Personal history of colonic polyps                           D12.2, Benign neoplasm of ascending colon                            D12.4, Benign neoplasm of descending colon                           D12.3, Benign neoplasm of transverse colon (hepatic                            flexure or splenic flexure)                           D12.5, Benign neoplasm of sigmoid colon                           K64.8, Other hemorrhoids CPT copyright 2022 American Medical Association. All rights reserved. The codes documented in this report are preliminary and upon coder review may  be revised to meet current compliance requirements. Samantha Cress, MD Samantha Cress,  05/12/2023 11:52:27 AM This report has been signed electronically. Number of Addenda: 0

## 2023-05-12 NOTE — Transfer of Care (Signed)
 Immediate Anesthesia Transfer of Care Note  Patient: [name] [facility]  Procedure(s) Performed: COLONOSCOPY  Patient Location: PACU  Anesthesia Type:General  Level of Consciousness: awake, alert , drowsy, and patient cooperative  Airway & Oxygen Therapy: Patient Spontanous Breathing and Patient connected to nasal cannula oxygen  Post-op Assessment: Report given to RN, Post -op Vital signs reviewed and stable, and Patient moving all extremities X 4  Post vital signs: Reviewed and stable  Last Vitals:  Vitals Value Taken Time  BP 97/53   Temp 97   Pulse 59   Resp 12   SpO2 97     Last Pain:  Vitals:   05/12/23 1052  TempSrc:   PainSc: 0-No pain      Patients Stated Pain Goal: 7 (05/12/23 0921)  Complications: No notable events documented.

## 2023-05-12 NOTE — Telephone Encounter (Signed)
-----   Message from Samantha Cress Mayorga sent at 05/12/2023  3:09 PM EDT ----- Fredirick Jasmine, can you please refer the patient to genetic counseling? Dx: colonic polyposis  Thanks,   Samantha Cress, MD Gastroenterology and Hepatology [facility]

## 2023-05-12 NOTE — Discharge Instructions (Signed)
 You are being discharged to home.  Resume your previous diet.  We are waiting for your pathology results.  Your physician has recommended a repeat colonoscopy in one year for surveillance. Will refer to genetic counselor given colonic polyposis.

## 2023-05-13 ENCOUNTER — Encounter (HOSPITAL_COMMUNITY): Payer: Self-pay | Admitting: Gastroenterology

## 2023-05-13 ENCOUNTER — Encounter (INDEPENDENT_AMBULATORY_CARE_PROVIDER_SITE_OTHER): Payer: Self-pay | Admitting: *Deleted

## 2023-05-13 LAB — SURGICAL PATHOLOGY

## 2023-05-13 NOTE — Anesthesia Postprocedure Evaluation (Signed)
 Anesthesia Post Note  Patient: [name] [facility]  Procedure(s) Performed: COLONOSCOPY  Patient location during evaluation: Phase II Anesthesia Type: General Level of consciousness: awake Pain management: pain level controlled Vital Signs Assessment: post-procedure vital signs reviewed and stable Respiratory status: spontaneous breathing and respiratory function stable Cardiovascular status: blood pressure returned to baseline and stable Postop Assessment: no headache and no apparent nausea or vomiting Anesthetic complications: no Comments: Late entry   No notable events documented.   Last Vitals:  Vitals:   05/12/23 1152 05/12/23 1158  BP: (!) 99/51 106/62  Pulse: (!) 52   Resp: 14   Temp:    SpO2: 98%     Last Pain:  Vitals:   05/12/23 1152  TempSrc:   PainSc: 0-No pain                 Coretha Dew

## 2023-05-17 ENCOUNTER — Encounter (INDEPENDENT_AMBULATORY_CARE_PROVIDER_SITE_OTHER): Payer: Self-pay | Admitting: *Deleted

## 2023-05-24 DIAGNOSIS — I10 Essential (primary) hypertension: Secondary | ICD-10-CM | POA: Diagnosis not present

## 2023-05-24 DIAGNOSIS — J449 Chronic obstructive pulmonary disease, unspecified: Secondary | ICD-10-CM | POA: Diagnosis not present

## 2023-07-06 DIAGNOSIS — E782 Mixed hyperlipidemia: Secondary | ICD-10-CM | POA: Diagnosis not present

## 2023-07-06 DIAGNOSIS — I10 Essential (primary) hypertension: Secondary | ICD-10-CM | POA: Diagnosis not present

## 2023-07-06 DIAGNOSIS — J449 Chronic obstructive pulmonary disease, unspecified: Secondary | ICD-10-CM | POA: Diagnosis not present

## 2023-07-06 DIAGNOSIS — N1831 Chronic kidney disease, stage 3a: Secondary | ICD-10-CM | POA: Diagnosis not present

## 2023-07-06 DIAGNOSIS — Z0000 Encounter for general adult medical examination without abnormal findings: Secondary | ICD-10-CM | POA: Diagnosis not present

## 2023-07-06 DIAGNOSIS — I700 Atherosclerosis of aorta: Secondary | ICD-10-CM | POA: Diagnosis not present

## 2023-07-06 DIAGNOSIS — M5459 Other low back pain: Secondary | ICD-10-CM | POA: Diagnosis not present

## 2023-07-08 ENCOUNTER — Other Ambulatory Visit (HOSPITAL_COMMUNITY): Payer: Self-pay | Admitting: Internal Medicine

## 2023-07-08 DIAGNOSIS — I10 Essential (primary) hypertension: Secondary | ICD-10-CM | POA: Diagnosis not present

## 2023-07-08 DIAGNOSIS — J449 Chronic obstructive pulmonary disease, unspecified: Secondary | ICD-10-CM | POA: Diagnosis not present

## 2023-07-08 DIAGNOSIS — Z87891 Personal history of nicotine dependence: Secondary | ICD-10-CM

## 2023-07-08 DIAGNOSIS — Z136 Encounter for screening for cardiovascular disorders: Secondary | ICD-10-CM

## 2023-07-08 DIAGNOSIS — Z13220 Encounter for screening for lipoid disorders: Secondary | ICD-10-CM

## 2023-07-09 DIAGNOSIS — E782 Mixed hyperlipidemia: Secondary | ICD-10-CM | POA: Diagnosis not present

## 2023-07-09 DIAGNOSIS — I10 Essential (primary) hypertension: Secondary | ICD-10-CM | POA: Diagnosis not present

## 2023-07-09 DIAGNOSIS — I700 Atherosclerosis of aorta: Secondary | ICD-10-CM | POA: Diagnosis not present

## 2023-07-09 DIAGNOSIS — J449 Chronic obstructive pulmonary disease, unspecified: Secondary | ICD-10-CM | POA: Diagnosis not present

## 2023-09-08 DIAGNOSIS — J449 Chronic obstructive pulmonary disease, unspecified: Secondary | ICD-10-CM | POA: Diagnosis not present

## 2023-09-08 DIAGNOSIS — I10 Essential (primary) hypertension: Secondary | ICD-10-CM | POA: Diagnosis not present

## 2023-10-05 DIAGNOSIS — I700 Atherosclerosis of aorta: Secondary | ICD-10-CM | POA: Diagnosis not present

## 2023-10-05 DIAGNOSIS — N182 Chronic kidney disease, stage 2 (mild): Secondary | ICD-10-CM | POA: Diagnosis not present

## 2023-10-05 DIAGNOSIS — E782 Mixed hyperlipidemia: Secondary | ICD-10-CM | POA: Diagnosis not present

## 2023-10-05 DIAGNOSIS — J449 Chronic obstructive pulmonary disease, unspecified: Secondary | ICD-10-CM | POA: Diagnosis not present

## 2023-10-05 DIAGNOSIS — L570 Actinic keratosis: Secondary | ICD-10-CM | POA: Diagnosis not present

## 2023-10-05 DIAGNOSIS — I10 Essential (primary) hypertension: Secondary | ICD-10-CM | POA: Diagnosis not present

## 2023-10-05 DIAGNOSIS — M5459 Other low back pain: Secondary | ICD-10-CM | POA: Diagnosis not present

## 2023-10-14 DIAGNOSIS — I10 Essential (primary) hypertension: Secondary | ICD-10-CM | POA: Diagnosis not present

## 2023-10-14 DIAGNOSIS — J449 Chronic obstructive pulmonary disease, unspecified: Secondary | ICD-10-CM | POA: Diagnosis not present
